# Patient Record
Sex: Female | Born: 1960 | Race: White | Hispanic: No | Smoking: Former smoker
Health system: Southern US, Community
[De-identification: ages and names within clinical notes are randomized; demographics above are authoritative.]

## PROBLEM LIST (undated history)

## (undated) DIAGNOSIS — K219 Gastro-esophageal reflux disease without esophagitis: Secondary | ICD-10-CM

## (undated) DIAGNOSIS — Z720 Tobacco use: Secondary | ICD-10-CM

## (undated) DIAGNOSIS — I509 Heart failure, unspecified: Secondary | ICD-10-CM

## (undated) DIAGNOSIS — R569 Unspecified convulsions: Secondary | ICD-10-CM

## (undated) DIAGNOSIS — F32A Depression, unspecified: Secondary | ICD-10-CM

## (undated) DIAGNOSIS — T4145XA Adverse effect of unspecified anesthetic, initial encounter: Secondary | ICD-10-CM

## (undated) DIAGNOSIS — R112 Nausea with vomiting, unspecified: Secondary | ICD-10-CM

## (undated) DIAGNOSIS — J45909 Unspecified asthma, uncomplicated: Secondary | ICD-10-CM

## (undated) DIAGNOSIS — I499 Cardiac arrhythmia, unspecified: Secondary | ICD-10-CM

## (undated) DIAGNOSIS — F329 Major depressive disorder, single episode, unspecified: Secondary | ICD-10-CM

## (undated) DIAGNOSIS — Z8614 Personal history of Methicillin resistant Staphylococcus aureus infection: Secondary | ICD-10-CM

## (undated) DIAGNOSIS — I10 Essential (primary) hypertension: Secondary | ICD-10-CM

## (undated) DIAGNOSIS — T8859XA Other complications of anesthesia, initial encounter: Secondary | ICD-10-CM

## (undated) DIAGNOSIS — Z780 Asymptomatic menopausal state: Secondary | ICD-10-CM

## (undated) DIAGNOSIS — Z9889 Other specified postprocedural states: Secondary | ICD-10-CM

## (undated) DIAGNOSIS — M797 Fibromyalgia: Secondary | ICD-10-CM

## (undated) DIAGNOSIS — Z716 Tobacco abuse counseling: Secondary | ICD-10-CM

## (undated) DIAGNOSIS — J449 Chronic obstructive pulmonary disease, unspecified: Secondary | ICD-10-CM

## (undated) DIAGNOSIS — I219 Acute myocardial infarction, unspecified: Secondary | ICD-10-CM

## (undated) DIAGNOSIS — I639 Cerebral infarction, unspecified: Secondary | ICD-10-CM

## (undated) DIAGNOSIS — F4310 Post-traumatic stress disorder, unspecified: Secondary | ICD-10-CM

## (undated) DIAGNOSIS — F419 Anxiety disorder, unspecified: Secondary | ICD-10-CM

## (undated) HISTORY — DX: Tobacco use: Z72.0

## (undated) HISTORY — PX: SPINE SURGERY: SHX786

## (undated) HISTORY — DX: Tobacco abuse counseling: Z71.6

## (undated) HISTORY — DX: Essential (primary) hypertension: I10

## (undated) HISTORY — DX: Asymptomatic menopausal state: Z78.0

---
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
## 1996-11-01 HISTORY — PX: LAPAROSCOPIC SUPRACERVICAL HYSTERECTOMY: SUR797

## 2002-02-19 ENCOUNTER — Inpatient Hospital Stay (HOSPITAL_COMMUNITY): Admission: EM | Admit: 2002-02-19 | Discharge: 2002-02-22 | Payer: Self-pay | Admitting: Psychiatry

## 2002-02-23 ENCOUNTER — Other Ambulatory Visit (HOSPITAL_COMMUNITY): Admission: RE | Admit: 2002-02-23 | Discharge: 2002-03-08 | Payer: Self-pay | Admitting: Psychiatry

## 2002-03-20 ENCOUNTER — Encounter: Admission: RE | Admit: 2002-03-20 | Discharge: 2002-03-20 | Payer: Self-pay | Admitting: *Deleted

## 2002-07-30 ENCOUNTER — Inpatient Hospital Stay (HOSPITAL_COMMUNITY): Admission: RE | Admit: 2002-07-30 | Discharge: 2002-07-31 | Payer: Self-pay | Admitting: Neurosurgery

## 2002-07-30 ENCOUNTER — Encounter: Payer: Self-pay | Admitting: Neurosurgery

## 2002-10-12 ENCOUNTER — Ambulatory Visit: Admission: RE | Admit: 2002-10-12 | Discharge: 2002-10-12 | Payer: Self-pay | Admitting: *Deleted

## 2002-12-12 ENCOUNTER — Encounter: Admission: RE | Admit: 2002-12-12 | Discharge: 2002-12-12 | Payer: Self-pay | Admitting: *Deleted

## 2003-03-25 ENCOUNTER — Encounter: Admission: RE | Admit: 2003-03-25 | Discharge: 2003-03-25 | Payer: Self-pay | Admitting: *Deleted

## 2007-11-02 DIAGNOSIS — Z8489 Family history of other specified conditions: Secondary | ICD-10-CM

## 2007-11-02 HISTORY — DX: Family history of other specified conditions: Z84.89

## 2009-05-29 ENCOUNTER — Ambulatory Visit: Payer: Self-pay | Admitting: Otolaryngology

## 2009-06-03 ENCOUNTER — Ambulatory Visit: Payer: Self-pay | Admitting: Otolaryngology

## 2009-06-09 ENCOUNTER — Ambulatory Visit: Payer: Self-pay | Admitting: Otolaryngology

## 2009-06-09 ENCOUNTER — Ambulatory Visit: Payer: Self-pay | Admitting: Cardiology

## 2009-06-11 ENCOUNTER — Ambulatory Visit: Payer: Self-pay | Admitting: Otolaryngology

## 2011-07-08 ENCOUNTER — Other Ambulatory Visit: Payer: Self-pay | Admitting: Internal Medicine

## 2011-07-09 MED ORDER — QUETIAPINE FUMARATE 300 MG PO TABS: 300.0000 mg | ORAL_TABLET | Freq: Every day | ORAL | Status: DC

## 2011-07-09 NOTE — Telephone Encounter (Signed)
I did not recognize patients name either, the pharmacy stated you were the last one to refill medication.

## 2011-07-09 NOTE — Telephone Encounter (Signed)
I do not recognize this patient's name.  Can we confirm that she is my patient?

## 2011-08-06 ENCOUNTER — Other Ambulatory Visit: Payer: Self-pay | Admitting: Internal Medicine

## 2011-08-06 MED ORDER — HYDROCODONE-ACETAMINOPHEN 7.5-750 MG PO TABS: 1.0000 | ORAL_TABLET | Freq: Two times a day (BID) | ORAL | Status: DC | PRN

## 2011-09-02 ENCOUNTER — Other Ambulatory Visit: Payer: Self-pay | Admitting: Internal Medicine

## 2011-09-02 NOTE — Telephone Encounter (Signed)
This patient has not been seen here yet or abstracted, so I cannot  Fill this without having her records,

## 2011-09-03 ENCOUNTER — Other Ambulatory Visit: Payer: Self-pay | Admitting: Internal Medicine

## 2011-09-06 ENCOUNTER — Telehealth: Payer: Self-pay | Admitting: Internal Medicine

## 2011-09-06 NOTE — Telephone Encounter (Signed)
It has not been filled because she has not been seen her at the new office and i do not have her records so I cannot refill it wihtout knowing why she is still needing it.

## 2011-09-06 NOTE — Telephone Encounter (Signed)
Patient needing a refill on her Hydrocodone. Pleasecall patient if there is any problems.

## 2011-09-06 NOTE — Telephone Encounter (Signed)
Patients phone number has been disconnected.

## 2011-09-06 NOTE — Telephone Encounter (Signed)
Yes,m she has not been seen at the nerw office and her records have not been abstracted os I do not know what it is for.

## 2011-09-07 ENCOUNTER — Ambulatory Visit (INDEPENDENT_AMBULATORY_CARE_PROVIDER_SITE_OTHER): Payer: 59 | Admitting: Internal Medicine

## 2011-09-07 ENCOUNTER — Encounter: Payer: Self-pay | Admitting: Internal Medicine

## 2011-09-07 VITALS — BP 142/84 | HR 114 | Temp 98.3°F | Resp 16 | Ht 63.0 in | Wt 139.8 lb

## 2011-09-07 DIAGNOSIS — F4310 Post-traumatic stress disorder, unspecified: Secondary | ICD-10-CM | POA: Insufficient documentation

## 2011-09-07 DIAGNOSIS — Z13220 Encounter for screening for lipoid disorders: Secondary | ICD-10-CM

## 2011-09-07 DIAGNOSIS — Z716 Tobacco abuse counseling: Secondary | ICD-10-CM

## 2011-09-07 DIAGNOSIS — Z1239 Encounter for other screening for malignant neoplasm of breast: Secondary | ICD-10-CM

## 2011-09-07 DIAGNOSIS — Z7189 Other specified counseling: Secondary | ICD-10-CM

## 2011-09-07 DIAGNOSIS — N952 Postmenopausal atrophic vaginitis: Secondary | ICD-10-CM

## 2011-09-07 DIAGNOSIS — R5383 Other fatigue: Secondary | ICD-10-CM

## 2011-09-07 MED ORDER — ESTROGENS, CONJUGATED 0.625 MG/GM VA CREA: 1.0000 g | TOPICAL_CREAM | Freq: Every day | VAGINAL | Status: AC

## 2011-09-07 MED ORDER — HYDROCODONE-ACETAMINOPHEN 7.5-750 MG PO TABS: 1.0000 | ORAL_TABLET | Freq: Two times a day (BID) | ORAL | Status: DC | PRN

## 2011-09-07 MED ORDER — ALPRAZOLAM 0.5 MG PO TABS: 0.5000 mg | ORAL_TABLET | Freq: Every day | ORAL | Status: DC

## 2011-09-07 NOTE — Progress Notes (Signed)
  Subjective:    Patient ID: Jessica Moran, female    DOB: [birth year], 50 y.o.   MRN: [id number]  HPI Ms. Brooke Dare (until recently, Ladona Ridgel, now divorced) is a 50 yo white female with a history of OA of hip since 2006, treated prevsiously with NSAIDs which adveresely affected her blood pressure.Her pain si currently managed with vicodin, but she has run ou and has been using naproxen in the interi.  The pain is now constant, worse in the evening and wakes her up at night,  Right hip, last x ray was normal 2006.  Marland Kitchen Also has low back pain secondary to DJD.  Prior cervical stenosis treated surgically by [facility] Coral about 10 ys ago with resolution of pain and numbness.  Back pain is constant, nonradiating,  24/7.  Her last use of physical therapy to address back pain was 10 yrs ago. Not interested in physical therapy currently because of her work and family schedule. Uses two vicodin daily to control her pain. Past Medical History  Diagnosis Date  . Tobacco abuse counseling       Review of Systems  Constitutional: Negative for fever, chills and unexpected weight change.  HENT: Negative for hearing loss, ear pain, nosebleeds, congestion, sore throat, facial swelling, rhinorrhea, sneezing, mouth sores, trouble swallowing, neck pain, neck stiffness, voice change, postnasal drip, sinus pressure, tinnitus and ear discharge.   Eyes: Negative for pain, discharge, redness and visual disturbance.  Respiratory: Negative for cough, chest tightness, shortness of breath, wheezing and stridor.   Cardiovascular: Negative for chest pain, palpitations and leg swelling.  Genitourinary: Positive for vaginal pain.  Musculoskeletal: Positive for back pain. Negative for myalgias and arthralgias.  Skin: Negative for color change and rash.  Neurological: Negative for dizziness, weakness, light-headedness and headaches.  Hematological: Negative for adenopathy.   BP 142/84  Pulse 114  Temp(Src) 98.3 F (36.8 C) (Oral)  Resp  16  Ht 5\' 3"  (1.6 m)  Wt 139 lb 12 oz (63.39 kg)  BMI 24.76 kg/m2  SpO2 97%     Objective:   Physical Exam  Constitutional: She is oriented to person, place, and time. She appears well-developed and well-nourished.  HENT:  Mouth/Throat: Oropharynx is clear and moist.  Eyes: EOM are normal. Pupils are equal, round, and reactive to light. No scleral icterus.  Neck: Normal range of motion. Neck supple. No JVD present. No thyromegaly present.  Cardiovascular: Normal rate, regular rhythm, normal heart sounds and intact distal pulses.   Pulmonary/Chest: Effort normal and breath sounds normal.  Abdominal: Soft. Bowel sounds are normal. She exhibits no mass. There is no tenderness.  Musculoskeletal: Normal range of motion. She exhibits no edema.  Lymphadenopathy:    She has no cervical adenopathy.  Neurological: She is alert and oriented to person, place, and time.  Skin: Skin is warm and dry.  Psychiatric: She has a normal mood and affect.          Assessment & Plan:  Chronic low back pain :  Secondary to DDD: no signs or worsening stenosis.  Ok to refill vicodin.  Right hip pain:  Given her history of tobacco abuse, need to rule out AVN of hip.  Will order plain films of right hip.  Vaginal atrophy:  Trial of vaginal estrogen. Tobacco abuse:  counselling given.

## 2011-09-07 NOTE — Telephone Encounter (Signed)
Tried calling again. Number is still disconnected.

## 2011-09-08 ENCOUNTER — Encounter: Payer: Self-pay | Admitting: Internal Medicine

## 2011-09-08 DIAGNOSIS — I10 Essential (primary) hypertension: Secondary | ICD-10-CM | POA: Insufficient documentation

## 2011-09-08 DIAGNOSIS — Z720 Tobacco use: Secondary | ICD-10-CM | POA: Insufficient documentation

## 2011-09-08 DIAGNOSIS — Z780 Asymptomatic menopausal state: Secondary | ICD-10-CM | POA: Insufficient documentation

## 2011-09-08 NOTE — Assessment & Plan Note (Signed)
She was counselled briefly on the risks pf continued tobacco abuse and encouraged to quit using nonpharmacologic mehtods

## 2011-09-09 NOTE — Telephone Encounter (Signed)
Tried calling patient on all numbers listed in chart. One of the numbers have been disconnected and the other two are the wrong number. Will wait for patient to call office will ask for updated number.

## 2011-09-10 ENCOUNTER — Telehealth: Payer: Self-pay | Admitting: Internal Medicine

## 2011-09-10 MED ORDER — AZITHROMYCIN 250 MG PO TABS: ORAL_TABLET | ORAL | Status: AC

## 2011-09-10 NOTE — Telephone Encounter (Signed)
Given that Dr. Darrick Huntsman is not here and she was just seen, we can call in a Z-pack. Let's tenatively set up a follow up with Dr. Darrick Huntsman for next week in case symptoms not improving.

## 2011-09-10 NOTE — Telephone Encounter (Signed)
Patient notified. Rx sent to pharmacy. Patient says that she will call back to schedule appt .

## 2011-09-10 NOTE — Telephone Encounter (Signed)
Patient needing a Z-pak for her bronchitis.

## 2011-09-10 NOTE — Telephone Encounter (Signed)
Patient returned call. She says that all week she has felt like she had a cold. Started out with just a dry cough. She says that now she feels that it has all moved into her chest and she is worried that it has turned into bronchitis because she gets this every year. She is asking if she can get a zpak called in since she was just seen by Dr. Darrick Huntsman this week. I advised her that Dr. Darrick Huntsman is not here, but that I would send a note to Dr. Dan Humphreys. Please advise.

## 2011-09-10 NOTE — Telephone Encounter (Signed)
We need more information. Left message asking patient to return my call.

## 2011-11-04 ENCOUNTER — Other Ambulatory Visit: Payer: Self-pay | Admitting: *Deleted

## 2011-11-04 MED ORDER — QUETIAPINE FUMARATE 300 MG PO TABS: 300.0000 mg | ORAL_TABLET | Freq: Every day | ORAL | Status: DC

## 2011-11-04 NOTE — Telephone Encounter (Signed)
Faxed request from [facility], last filled 10/03/11.

## 2011-11-22 ENCOUNTER — Telehealth: Payer: Self-pay | Admitting: Internal Medicine

## 2011-11-22 NOTE — Telephone Encounter (Signed)
Increasing the alprazolam to 1 mg daily prn #30 no refills,  Ok to call in

## 2011-11-22 NOTE — Telephone Encounter (Signed)
Patient having hormonal problems wants to be seen.

## 2011-11-22 NOTE — Telephone Encounter (Signed)
Spoke with patient, she says that this has been going on for a while and that she discussed all of this with you at her last appt, but it hasn't gotten much better, she is still having hot flashed, still having hard time sleeping at night, still feeling tired at night. Patient has an appt scheduled for 12-03-11. Patient is asking if she can increase her xanax until she is seen. If so she will need new script

## 2011-11-23 MED ORDER — ALPRAZOLAM 1 MG PO TABS: 1.0000 mg | ORAL_TABLET | Freq: Every evening | ORAL | Status: AC | PRN

## 2011-11-23 NOTE — Telephone Encounter (Signed)
Left detailed message notifying patient. Rx called to pharmacy.

## 2011-12-03 ENCOUNTER — Encounter: Payer: Self-pay | Admitting: Internal Medicine

## 2011-12-03 ENCOUNTER — Ambulatory Visit (INDEPENDENT_AMBULATORY_CARE_PROVIDER_SITE_OTHER): Payer: 59 | Admitting: Internal Medicine

## 2011-12-03 DIAGNOSIS — Z780 Asymptomatic menopausal state: Secondary | ICD-10-CM

## 2011-12-03 DIAGNOSIS — N644 Mastodynia: Secondary | ICD-10-CM

## 2011-12-03 DIAGNOSIS — Z7189 Other specified counseling: Secondary | ICD-10-CM

## 2011-12-03 DIAGNOSIS — R5383 Other fatigue: Secondary | ICD-10-CM

## 2011-12-03 DIAGNOSIS — Z13220 Encounter for screening for lipoid disorders: Secondary | ICD-10-CM

## 2011-12-03 DIAGNOSIS — N951 Menopausal and female climacteric states: Secondary | ICD-10-CM

## 2011-12-03 DIAGNOSIS — Z716 Tobacco abuse counseling: Secondary | ICD-10-CM

## 2011-12-03 DIAGNOSIS — R5381 Other malaise: Secondary | ICD-10-CM

## 2011-12-03 DIAGNOSIS — F4310 Post-traumatic stress disorder, unspecified: Secondary | ICD-10-CM

## 2011-12-03 MED ORDER — ESCITALOPRAM OXALATE 10 MG PO TABS: 10.0000 mg | ORAL_TABLET | Freq: Every day | ORAL | Status: DC
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
## 2011-12-05 ENCOUNTER — Encounter: Payer: Self-pay | Admitting: Internal Medicine

## 2011-12-05 DIAGNOSIS — N644 Mastodynia: Secondary | ICD-10-CM | POA: Insufficient documentation

## 2011-12-05 NOTE — Assessment & Plan Note (Signed)
Now resolved spontaneously. She did undergo mammogram and ultrasound to rule out perforation of saline implants. I suspect that she was having fluid retention which was causing increased pressure. We discussed the next time this happened to it at a trial of a diuretic to see if it helped. She will need to be seen of course make sure that she's not having mastitis or other signs of cellulitis.

## 2011-12-05 NOTE — Assessment & Plan Note (Signed)
Spent 20 minutes discussing the patient's current tobacco use and its hazards. She will attempt cessation once her anxiety and menopause symptoms are under control. She is not interested in pharmacotherapy at this point.

## 2011-12-05 NOTE — Assessment & Plan Note (Signed)
With hot flashes mood swings and sleep disruption. She is not a candidate for oral or systemic hormone replacement therapy due to her tobacco use. She is using vaginal estrogen. Discussed trial of Lexapro for management of hot flashes. Currently she is using alprazolam twice daily which has is helping but we are both concerned about it becoming habit-forming.
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
## 2011-12-27 ENCOUNTER — Other Ambulatory Visit: Payer: Self-pay | Admitting: Internal Medicine

## 2011-12-27 DIAGNOSIS — F4310 Post-traumatic stress disorder, unspecified: Secondary | ICD-10-CM

## 2011-12-27 MED ORDER — ALPRAZOLAM 0.5 MG PO TABS: 0.5000 mg | ORAL_TABLET | Freq: Every day | ORAL | Status: DC

## 2011-12-29 ENCOUNTER — Other Ambulatory Visit: Payer: Self-pay | Admitting: Internal Medicine

## 2011-12-29 MED ORDER — ALPRAZOLAM 1 MG PO TABS: 1.0000 mg | ORAL_TABLET | Freq: Every evening | ORAL | Status: AC | PRN

## 2011-12-30 ENCOUNTER — Other Ambulatory Visit: Payer: Self-pay | Admitting: Internal Medicine

## 2011-12-31 ENCOUNTER — Other Ambulatory Visit: Payer: Self-pay | Admitting: Internal Medicine

## 2011-12-31 MED ORDER — HYDROCODONE-ACETAMINOPHEN 7.5-750 MG PO TABS: 1.0000 | ORAL_TABLET | Freq: Two times a day (BID) | ORAL | Status: DC | PRN

## 2012-01-06 ENCOUNTER — Ambulatory Visit: Payer: 59 | Admitting: Internal Medicine

## 2012-01-06 DIAGNOSIS — Z0289 Encounter for other administrative examinations: Secondary | ICD-10-CM

## 2012-01-31 ENCOUNTER — Other Ambulatory Visit: Payer: Self-pay | Admitting: Internal Medicine

## 2012-01-31 MED ORDER — HYDROCODONE-ACETAMINOPHEN 7.5-750 MG PO TABS: 1.0000 | ORAL_TABLET | Freq: Two times a day (BID) | ORAL | Status: DC | PRN

## 2012-02-09 ENCOUNTER — Other Ambulatory Visit (INDEPENDENT_AMBULATORY_CARE_PROVIDER_SITE_OTHER): Payer: 59 | Admitting: *Deleted

## 2012-02-09 DIAGNOSIS — Z13220 Encounter for screening for lipoid disorders: Secondary | ICD-10-CM

## 2012-02-09 DIAGNOSIS — R5383 Other fatigue: Secondary | ICD-10-CM

## 2012-02-09 LAB — LIPID PANEL
Cholesterol: 226 mg/dL — ABNORMAL HIGH (ref 0–200)
Total CHOL/HDL Ratio: 2
Triglycerides: 82 mg/dL (ref 0.0–149.0)
VLDL: 16.4 mg/dL (ref 0.0–40.0)

## 2012-02-09 LAB — CBC WITH DIFFERENTIAL/PLATELET
Basophils Relative: 1.2 % (ref 0.0–3.0)
Eosinophils Absolute: 0.2 10*3/uL (ref 0.0–0.7)
Hemoglobin: 13.9 g/dL (ref 12.0–15.0)
MCHC: 33.8 g/dL (ref 30.0–36.0)
MCV: 97.1 fl (ref 78.0–100.0)
Monocytes Absolute: 0.5 10*3/uL (ref 0.1–1.0)
Neutro Abs: 3.1 10*3/uL (ref 1.4–7.7)
RBC: 4.22 Mil/uL (ref 3.87–5.11)

## 2012-02-09 LAB — COMPREHENSIVE METABOLIC PANEL
AST: 24 U/L (ref 0–37)
Alkaline Phosphatase: 84 U/L (ref 39–117)
BUN: 13 mg/dL (ref 6–23)
Creatinine, Ser: 0.6 mg/dL (ref 0.4–1.2)

## 2012-02-10 LAB — LDL CHOLESTEROL, DIRECT: Direct LDL: 110.6 mg/dL

## 2012-02-14 ENCOUNTER — Encounter: Payer: 59 | Admitting: Internal Medicine

## 2012-03-30 ENCOUNTER — Encounter: Payer: 59 | Admitting: Internal Medicine

## 2012-03-30 DIAGNOSIS — Z0289 Encounter for other administrative examinations: Secondary | ICD-10-CM

## 2012-04-17 ENCOUNTER — Other Ambulatory Visit: Payer: Self-pay | Admitting: Internal Medicine

## 2012-04-19 ENCOUNTER — Other Ambulatory Visit: Payer: Self-pay | Admitting: Internal Medicine

## 2012-04-19 MED ORDER — ALPRAZOLAM 0.5 MG PO TABS: 0.5000 mg | ORAL_TABLET | Freq: Every evening | ORAL | Status: DC | PRN

## 2012-04-19 NOTE — Telephone Encounter (Signed)
rx changed to generic tablet,  Not the sublingual

## 2012-05-02 ENCOUNTER — Other Ambulatory Visit: Payer: Self-pay | Admitting: *Deleted

## 2012-05-02 MED ORDER — ALPRAZOLAM 0.5 MG PO TABS: 0.5000 mg | ORAL_TABLET | Freq: Every evening | ORAL | Status: DC | PRN

## 2012-05-09 ENCOUNTER — Other Ambulatory Visit: Payer: Self-pay | Admitting: Internal Medicine

## 2012-05-15 ENCOUNTER — Telehealth: Payer: Self-pay | Admitting: *Deleted

## 2012-05-15 NOTE — Telephone Encounter (Signed)
Received phone call from pharmacy stating that patient is requesting rx for 1 mg of alprazolam. We call in rx for the 0.5 on 05-02-12. Pharmacy states that she is supposed to have rx for both, but the 1 mg is not in her chart. Please advise.

## 2012-05-16 NOTE — Telephone Encounter (Signed)
Yes she was prescribed both per last office visit note.  Ok to refill both . But she no showed last visit so 2 refills only . Must be seen

## 2012-05-17 ENCOUNTER — Other Ambulatory Visit: Payer: Self-pay | Admitting: *Deleted

## 2012-05-17 MED ORDER — ALPRAZOLAM 1 MG PO TABS: 1.0000 mg | ORAL_TABLET | Freq: Every day | ORAL | Status: AC

## 2012-05-17 NOTE — Telephone Encounter (Signed)
Rx phoned in.   

## 2012-05-22 ENCOUNTER — Other Ambulatory Visit: Payer: Self-pay | Admitting: *Deleted

## 2012-05-23 ENCOUNTER — Other Ambulatory Visit: Payer: Self-pay | Admitting: Internal Medicine

## 2012-05-23 MED ORDER — HYDROCODONE-ACETAMINOPHEN 7.5-750 MG PO TABS
1.0000 | ORAL_TABLET | Freq: Two times a day (BID) | ORAL | Status: DC | PRN
Start: 2012-05-22 — End: 2012-07-19

## 2012-05-23 NOTE — Telephone Encounter (Signed)
No more refills without office visit.

## 2012-05-26 ENCOUNTER — Telehealth: Payer: Self-pay | Admitting: Internal Medicine

## 2012-05-26 NOTE — Telephone Encounter (Signed)
Casimiro Needle advised as instructed via telephone, #60 no refills.

## 2012-05-26 NOTE — Telephone Encounter (Signed)
Michael from [facility] called and stated the hydrocodone acetaminophen 7.5-750 mg has been discontinued.  He stated the new strength is 7.5-300 mg.  Is this ok to change?

## 2012-05-26 NOTE — Telephone Encounter (Signed)
Yes, ok to substitute

## 2012-07-17 ENCOUNTER — Other Ambulatory Visit: Payer: Self-pay | Admitting: Internal Medicine

## 2012-07-17 MED ORDER — ALPRAZOLAM 0.5 MG PO TABS: 0.5000 mg | ORAL_TABLET | Freq: Every evening | ORAL | Status: DC | PRN

## 2012-07-19 ENCOUNTER — Encounter: Payer: Self-pay | Admitting: Internal Medicine

## 2012-07-19 ENCOUNTER — Other Ambulatory Visit: Payer: Self-pay | Admitting: Internal Medicine

## 2012-07-19 DIAGNOSIS — M5136 Other intervertebral disc degeneration, lumbar region: Secondary | ICD-10-CM | POA: Insufficient documentation

## 2012-07-19 DIAGNOSIS — M169 Osteoarthritis of hip, unspecified: Secondary | ICD-10-CM | POA: Insufficient documentation

## 2012-07-19 MED ORDER — HYDROCODONE-ACETAMINOPHEN 7.5-750 MG PO TABS
1.0000 | ORAL_TABLET | Freq: Two times a day (BID) | ORAL | Status: DC | PRN
Start: 2012-07-19 — End: 2012-08-12

## 2012-08-10 ENCOUNTER — Other Ambulatory Visit: Payer: Self-pay | Admitting: Internal Medicine

## 2012-08-10 ENCOUNTER — Other Ambulatory Visit: Payer: Self-pay

## 2012-08-11 ENCOUNTER — Other Ambulatory Visit: Payer: Self-pay

## 2012-08-12 ENCOUNTER — Other Ambulatory Visit: Payer: Self-pay | Admitting: Internal Medicine

## 2012-08-13 NOTE — Telephone Encounter (Signed)
Alprazolam and vicodin refills authorized for one month only .  Needs office visit.

## 2012-08-14 ENCOUNTER — Other Ambulatory Visit: Payer: Self-pay

## 2012-08-14 NOTE — Telephone Encounter (Signed)
Rx xanax 1 mg #30 2 R and Hydrocodone Acetaminophen #60 0 R called in to pharmacy.

## 2012-08-15 ENCOUNTER — Other Ambulatory Visit: Payer: Self-pay | Admitting: Internal Medicine

## 2012-08-16 ENCOUNTER — Other Ambulatory Visit: Payer: Self-pay

## 2012-08-16 NOTE — Telephone Encounter (Addendum)
Rx hydrocodone-Acetamephin called in to [facility] [place]. Patient advised no further refills until she has an OV.

## 2012-08-16 NOTE — Telephone Encounter (Signed)
Believe this was handled yesterday. I authorized a 30 day supply and no more refills until she is seen

## 2012-09-07 ENCOUNTER — Other Ambulatory Visit: Payer: Self-pay

## 2012-09-07 NOTE — Telephone Encounter (Signed)
[facility] from [facility]  for Hydrocodone Acetaminophen 7.5-750 mg. Ok to refill? 

## 2012-09-08 ENCOUNTER — Other Ambulatory Visit: Payer: Self-pay

## 2012-09-08 MED ORDER — HYDROCODONE-ACETAMINOPHEN 7.5-750 MG PO TABS: 1.0000 | ORAL_TABLET | Freq: Two times a day (BID) | ORAL | Status: DC

## 2012-09-08 NOTE — Telephone Encounter (Signed)
[facility]ed to [facility] [phone]

## 2012-09-08 NOTE — Telephone Encounter (Signed)
Ok to refill,  Authorized in epic 

## 2012-09-08 NOTE — Telephone Encounter (Signed)
[facility] faxed to [facility] [phone]

## 2012-09-08 NOTE — Telephone Encounter (Signed)
Patient called requesting a refill on her hydrocodone Acetaminophen due to her insurance running out on tomorrow. Ok to refill.

## 2012-09-08 NOTE — Telephone Encounter (Signed)
30 days supply authorozed. Must be seen priro to additional refills

## 2012-09-11 ENCOUNTER — Other Ambulatory Visit: Payer: Self-pay | Admitting: Internal Medicine

## 2012-09-11 NOTE — Telephone Encounter (Signed)
[facility] [address] [facility] request  For vicidon.  Pt will lose her insurance end of week  And would like to get this refilled before end of week.  Please advise pt

## 2012-09-15 ENCOUNTER — Other Ambulatory Visit: Payer: Self-pay

## 2012-09-15 MED ORDER — HYDROCODONE-ACETAMINOPHEN 7.5-750 MG PO TABS: 1.0000 | ORAL_TABLET | Freq: Two times a day (BID) | ORAL | Status: DC

## 2012-09-15 NOTE — Telephone Encounter (Signed)
Rx Hydrocodone-Acetaminophen # 60 0 R called to [facility]

## 2012-09-15 NOTE — Telephone Encounter (Signed)
This was authorized on 11/8 see med refill history in chart

## 2012-10-04 ENCOUNTER — Other Ambulatory Visit: Payer: Self-pay | Admitting: Internal Medicine

## 2012-10-10 ENCOUNTER — Other Ambulatory Visit: Payer: Self-pay

## 2012-10-10 DIAGNOSIS — F4310 Post-traumatic stress disorder, unspecified: Secondary | ICD-10-CM

## 2012-10-10 MED ORDER — ALPRAZOLAM 1 MG PO TABS: 1.0000 mg | ORAL_TABLET | Freq: Every evening | ORAL | Status: DC | PRN

## 2012-10-10 NOTE — Addendum Note (Signed)
Addended by: Sherlene Shams on: 10/10/2012 05:18 PM   Modules accepted: Orders

## 2012-10-10 NOTE — Telephone Encounter (Signed)
Refill request for Xanax 0.5 mg. Ok to refill?

## 2012-10-10 NOTE — Telephone Encounter (Signed)
Alprazolam refills denied  She no showed her last appt and was given courtesy refills with the expectation that she would keep her appts. I do not want her to go through withdrawal so I will refill one prescription for 30 days only.  Absolutely no more refills after that unless she is seen

## 2012-10-11 ENCOUNTER — Other Ambulatory Visit: Payer: Self-pay

## 2012-10-11 NOTE — Telephone Encounter (Signed)
Xanax 1 mg [facility]ed [facility]

## 2012-10-12 ENCOUNTER — Other Ambulatory Visit: Payer: Self-pay

## 2012-11-02 ENCOUNTER — Other Ambulatory Visit: Payer: Self-pay | Admitting: Internal Medicine

## 2012-11-03 ENCOUNTER — Other Ambulatory Visit: Payer: Self-pay | Admitting: Internal Medicine

## 2012-11-03 DIAGNOSIS — F4310 Post-traumatic stress disorder, unspecified: Secondary | ICD-10-CM

## 2012-11-03 NOTE — Telephone Encounter (Signed)
Refill denied .  Has not kept appts has not been since in 11 months

## 2012-11-03 NOTE — Telephone Encounter (Signed)
Alprazolam 0.5 mg tablet  Take 1 tablet at bedtime as needed

## 2012-11-03 NOTE — Telephone Encounter (Signed)
Forwarding to Dr.Tullo 

## 2012-11-06 LAB — COMPREHENSIVE METABOLIC PANEL
Albumin: 3.8 g/dL (ref 3.4–5.0)
Alkaline Phosphatase: 126 U/L (ref 50–136)
BUN: 6 mg/dL — ABNORMAL LOW (ref 7–18)
Calcium, Total: 8.5 mg/dL (ref 8.5–10.1)
Creatinine: 0.79 mg/dL (ref 0.60–1.30)
Glucose: 96 mg/dL (ref 65–99)
Potassium: 3.9 mmol/L (ref 3.5–5.1)
SGOT(AST): 36 U/L (ref 15–37)
SGPT (ALT): 37 U/L (ref 12–78)

## 2012-11-06 LAB — URINALYSIS, COMPLETE
Glucose,UR: NEGATIVE mg/dL (ref 0–75)
Hyaline Cast: 8
Nitrite: NEGATIVE
Ph: 5 (ref 4.5–8.0)
Protein: 30
RBC,UR: 1 /HPF (ref 0–5)
Squamous Epithelial: 14

## 2012-11-06 LAB — CBC
HCT: 42.5 % (ref 35.0–47.0)
MCHC: 33 g/dL (ref 32.0–36.0)
MCV: 95 fL (ref 80–100)
RBC: 4.5 10*6/uL (ref 3.80–5.20)
RDW: 12.6 % (ref 11.5–14.5)
WBC: 4.1 10*3/uL (ref 3.6–11.0)

## 2012-11-06 MED ORDER — ALPRAZOLAM 1 MG PO TABS: 1.0000 mg | ORAL_TABLET | Freq: Every evening | ORAL | Status: DC | PRN

## 2012-11-06 NOTE — Telephone Encounter (Signed)
Per Pt circumstances Dr. Darrick Huntsman advised this medication refill for 30 days.

## 2012-11-06 NOTE — Telephone Encounter (Signed)
Refill denied.,  She no showed her last app 6 months ago and has not seen seen since February 2013.  Same goes for narcotics.  No refills until seen,

## 2012-11-07 ENCOUNTER — Observation Stay: Payer: Self-pay | Admitting: Internal Medicine

## 2012-11-07 LAB — TSH: Thyroid Stimulating Horm: 1.83 u[IU]/mL

## 2012-11-07 LAB — MAGNESIUM: Magnesium: 2.2 mg/dL

## 2012-11-08 LAB — DRUG SCREEN, URINE
Amphetamines, Ur Screen: NEGATIVE (ref ?–1000)
Barbiturates, Ur Screen: NEGATIVE (ref ?–200)
Benzodiazepine, Ur Scrn: NEGATIVE (ref ?–200)
Cocaine Metabolite,Ur ~~LOC~~: NEGATIVE (ref ?–300)
MDMA (Ecstasy)Ur Screen: NEGATIVE (ref ?–500)
Methadone, Ur Screen: NEGATIVE (ref ?–300)
Opiate, Ur Screen: NEGATIVE (ref ?–300)
Phencyclidine (PCP) Ur S: NEGATIVE (ref ?–25)
Tricyclic, Ur Screen: NEGATIVE (ref ?–1000)

## 2012-11-08 LAB — WBCS, STOOL

## 2012-11-08 LAB — CLOSTRIDIUM DIFFICILE BY PCR

## 2012-11-09 ENCOUNTER — Telehealth: Payer: Self-pay | Admitting: *Deleted

## 2012-11-09 ENCOUNTER — Ambulatory Visit (INDEPENDENT_AMBULATORY_CARE_PROVIDER_SITE_OTHER): Payer: 59 | Admitting: Internal Medicine

## 2012-11-09 ENCOUNTER — Encounter: Payer: Self-pay | Admitting: Internal Medicine

## 2012-11-09 VITALS — BP 134/82 | HR 104 | Temp 98.2°F | Resp 17 | Wt 160.0 lb

## 2012-11-09 DIAGNOSIS — M1610 Unilateral primary osteoarthritis, unspecified hip: Secondary | ICD-10-CM

## 2012-11-09 DIAGNOSIS — I10 Essential (primary) hypertension: Secondary | ICD-10-CM

## 2012-11-09 DIAGNOSIS — G40909 Epilepsy, unspecified, not intractable, without status epilepticus: Secondary | ICD-10-CM | POA: Insufficient documentation

## 2012-11-09 DIAGNOSIS — F4310 Post-traumatic stress disorder, unspecified: Secondary | ICD-10-CM

## 2012-11-09 DIAGNOSIS — I6350 Cerebral infarction due to unspecified occlusion or stenosis of unspecified cerebral artery: Secondary | ICD-10-CM

## 2012-11-09 DIAGNOSIS — E785 Hyperlipidemia, unspecified: Secondary | ICD-10-CM

## 2012-11-09 DIAGNOSIS — I6381 Other cerebral infarction due to occlusion or stenosis of small artery: Secondary | ICD-10-CM

## 2012-11-09 DIAGNOSIS — M169 Osteoarthritis of hip, unspecified: Secondary | ICD-10-CM

## 2012-11-09 MED ORDER — ESCITALOPRAM OXALATE 10 MG PO TABS: 10.0000 mg | ORAL_TABLET | Freq: Every day | ORAL | Status: DC

## 2012-11-09 MED ORDER — METOPROLOL SUCCINATE ER 25 MG PO TB24: 25.0000 mg | ORAL_TABLET | Freq: Every day | ORAL | Status: DC

## 2012-11-09 MED ORDER — ALPRAZOLAM 1 MG PO TABS: ORAL_TABLET | ORAL | Status: DC

## 2012-11-09 MED ORDER — HYDROCODONE-ACETAMINOPHEN 7.5-300 MG PO TABS: 1.0000 | ORAL_TABLET | Freq: Two times a day (BID) | ORAL | Status: DC | PRN

## 2012-11-09 NOTE — Patient Instructions (Signed)
Please resume your lexapro at 1.2 tablet daily for the first week,  Then on etablet daily  You should not drive a car until Dr. Sherryll Burger authorizes it  If you have any trouble getting the lamictal from your pharmacy call for [place] to send an rx to another pharmacy   You will see Dr Sherryll Burger on Jan 20th  1:15 pm at the [facility] in the medical arts building    Please return in a month
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
## 2012-11-09 NOTE — Telephone Encounter (Signed)
Patient called stating that she forgot to mention to Dr. Darrick Huntsman that when she was d/c from the hospital the doctor mentioned that she needed to be on cholesterol medication.  She would like [facility].

## 2012-11-09 NOTE — Assessment & Plan Note (Addendum)
I have spoken with Dr. Sherryll Burger by phone today. I informed him that her serum May have been due to alprazolam withdrawal since she was taking it twice daily (1.5 mg total daily dose ) and patient states that her last dose was 24 hours prior to seizure.  Dr. Sherryll Burger does not feel that her witnessed seizure based on the description would be congruence with benzo withdrawal but agrees that she should continue her alprazolam as previously taking. Patient will follow up with Dr. Sherryll Burger on January 20. She will start her Lamictal immediately. She cannot drive for 6 months. 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 I suggested that she make a arrangements to have him drive her to her home visits so she can continue to work

## 2012-11-10 LAB — STOOL CULTURE

## 2012-11-10 MED ORDER — PRAVASTATIN SODIUM 20 MG PO TABS: 20.0000 mg | ORAL_TABLET | Freq: Every day | ORAL | Status: DC

## 2012-11-10 NOTE — Telephone Encounter (Signed)
rx for pravastatin sent. She needs to have repeat fasting lipids and CEMT in 6 weeks

## 2012-11-10 NOTE — Telephone Encounter (Signed)
Pt.notified

## 2012-11-11 ENCOUNTER — Encounter: Payer: Self-pay | Admitting: Internal Medicine

## 2012-11-11 DIAGNOSIS — G40909 Epilepsy, unspecified, not intractable, without status epilepticus: Secondary | ICD-10-CM | POA: Insufficient documentation

## 2012-11-11 DIAGNOSIS — I6381 Other cerebral infarction due to occlusion or stenosis of small artery: Secondary | ICD-10-CM | POA: Insufficient documentation

## 2012-11-11 DIAGNOSIS — I10 Essential (primary) hypertension: Secondary | ICD-10-CM | POA: Insufficient documentation
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
## 2012-11-11 NOTE — Assessment & Plan Note (Signed)
Controlled with metoprolol. Patient stopped her medication for unclear reasons several months ago. She was lost to followup as well. She has resumed her Toprol. No changes today

## 2012-11-11 NOTE — Assessment & Plan Note (Addendum)
The MRI with and without contrast done during recent admission for clonic seizure noted a right basal ganglia infarct which was acute. She has no signs of paresthesias or muscle weakness. Her blood pressure is well controlled currently and we'll start pravastatin for lipid management. She will return in 6 weeks for fasting lipids and cmet

## 2012-11-13 ENCOUNTER — Other Ambulatory Visit: Payer: Self-pay | Admitting: Internal Medicine

## 2012-11-13 NOTE — Telephone Encounter (Signed)
Med filled on 11/09/12.

## 2012-11-14 ENCOUNTER — Telehealth: Payer: Self-pay | Admitting: Internal Medicine

## 2012-11-14 ENCOUNTER — Encounter: Payer: Self-pay | Admitting: General Practice

## 2012-11-14 NOTE — Telephone Encounter (Signed)
I can write this note. How long should she be out of work?

## 2012-11-14 NOTE — Telephone Encounter (Signed)
Letter written

## 2012-11-14 NOTE — Telephone Encounter (Signed)
Patient needing a note for work stating she can't drive. Because of her seizures.

## 2012-11-14 NOTE — Telephone Encounter (Signed)
6 months

## 2012-11-21 ENCOUNTER — Telehealth: Payer: Self-pay | Admitting: Internal Medicine

## 2012-11-21 NOTE — Telephone Encounter (Signed)
Placed on providers desk

## 2012-11-21 NOTE — Telephone Encounter (Signed)
Pt dropped off FMLA forms to be filled out. I put them up front in Dr. Melina Schools inbox.

## 2012-11-24 DIAGNOSIS — Z0279 Encounter for issue of other medical certificate: Secondary | ICD-10-CM

## 2012-12-13 ENCOUNTER — Ambulatory Visit: Payer: 59 | Admitting: Internal Medicine

## 2012-12-27 ENCOUNTER — Telehealth: Payer: Self-pay | Admitting: Internal Medicine

## 2012-12-27 MED ORDER — ESCITALOPRAM OXALATE 20 MG PO TABS: 20.0000 mg | ORAL_TABLET | Freq: Every day | ORAL | Status: DC

## 2012-12-27 NOTE — Telephone Encounter (Signed)
Pt asking for Rx for Lexapro.

## 2012-12-27 NOTE — Telephone Encounter (Signed)
Last office visit 11/09/12. [place]. Patient states that she is not doing good on the 10 mg and wants to know if you will increase the Lexapro to 20 Mg? Please advise

## 2012-12-27 NOTE — Telephone Encounter (Signed)
Yes, [facility],  Is she seeing a Haematologist or psychiatrist?

## 2012-12-27 NOTE — Telephone Encounter (Signed)
Pt left vm stating she needs to speak with someone about her lexapro.

## 2012-12-28 NOTE — Telephone Encounter (Signed)
Dr. Darrick Huntsman,  Patient stated that this has been an ongoing problem for her since she was [age] and she has been handling okay. She also expressed that she was fine without counseling and that her life circumstances triggered her mental state.

## 2013-01-04 ENCOUNTER — Telehealth: Payer: Self-pay | Admitting: General Practice

## 2013-01-04 MED ORDER — QUETIAPINE FUMARATE 300 MG PO TABS: 300.0000 mg | ORAL_TABLET | Freq: Every day | ORAL | Status: DC

## 2013-01-04 NOTE — Telephone Encounter (Signed)
Seroquil increased to 150mg  and 300 in the evening, after the hospital visit. Dr. Clelia Croft refilled for one month. He states that Dr. Darrick Huntsman should fill this. Pt would like this to be filled for just the 300mg  in the evening. Can we fill this?

## 2013-01-04 NOTE — Telephone Encounter (Signed)
Ok to refill,  Refill sent  

## 2013-02-28 ENCOUNTER — Telehealth: Payer: Self-pay | Admitting: *Deleted

## 2013-02-28 MED ORDER — HYDROCODONE-ACETAMINOPHEN 7.5-325 MG PO TABS: 1.0000 | ORAL_TABLET | Freq: Two times a day (BID) | ORAL | Status: DC | PRN

## 2013-02-28 NOTE — Telephone Encounter (Signed)
Pt called to report that she has recently moved to [place], [place] & has been working on transferring meds to a Social worker. She states that her Vicodin (last filled on 3/25) has been the only issue. IT was written for 7.5/300 & [facility] only carries the 7.5/325mg . Pt is requesting a change in dose so she can have it filled, & also would like to know if you are familiar with anyone in [place], [place] that she can go see for PCP care. Please advise

## 2013-02-28 NOTE — Telephone Encounter (Signed)
rx changed and printed.  I do not know any MDs in [place]./

## 2013-03-01 NOTE — Telephone Encounter (Signed)
Script called patient notified.

## 2013-03-05 ENCOUNTER — Other Ambulatory Visit: Payer: Self-pay | Admitting: Internal Medicine

## 2013-03-05 NOTE — Telephone Encounter (Signed)
Alprazolam Ok to refill,  printed rx

## 2013-03-05 NOTE — Telephone Encounter (Signed)
Last visit 11/09/12.

## 2013-03-07 ENCOUNTER — Telehealth: Payer: Self-pay | Admitting: Internal Medicine

## 2013-03-07 NOTE — Telephone Encounter (Signed)
Left message on voicemail for patient to return call. 

## 2013-03-07 NOTE — Telephone Encounter (Signed)
Patient left voice mail asking you to return her call. She didn't say what it was reference to she just said she needed to discuss something with you.

## 2013-03-08 NOTE — Telephone Encounter (Signed)
Left message for patient to call office.  

## 2013-03-12 NOTE — Telephone Encounter (Signed)
Made several attempts to call patient . No answer left message.

## 2013-03-13 NOTE — Telephone Encounter (Signed)
Left message for patient to return call to office. 

## 2013-03-15 NOTE — Telephone Encounter (Signed)
tried to return patient call received recording patient not available no voicemail.

## 2013-03-16 NOTE — Telephone Encounter (Signed)
Left message for patient to return call.

## 2013-04-05 ENCOUNTER — Other Ambulatory Visit: Payer: Self-pay | Admitting: Internal Medicine

## 2013-04-05 NOTE — Telephone Encounter (Signed)
Refill? Last visit was followup 11/2012

## 2013-04-05 NOTE — Telephone Encounter (Signed)
Ok to refill,  printed rx  

## 2013-04-09 NOTE — Telephone Encounter (Signed)
Rx was faxed to pharmacy 04/06/13 per Henrene Pastor, LPN.

## 2013-04-18 ENCOUNTER — Telehealth: Payer: Self-pay | Admitting: *Deleted

## 2013-04-18 MED ORDER — ALPRAZOLAM 1 MG PO TABS: ORAL_TABLET | ORAL | Status: DC

## 2013-04-18 MED ORDER — QUETIAPINE FUMARATE 300 MG PO TABS: ORAL_TABLET | ORAL | Status: DC

## 2013-04-18 NOTE — Telephone Encounter (Signed)
Yes, ok to fill both.  rx printed

## 2013-04-18 NOTE — Telephone Encounter (Signed)
Patient has moved to [facility] and is trying to get insurance and a physician she would like to know if you will refill her Alprazolam and her Quetiapine until she can get a physician there just for thirty days please advise.

## 2013-04-18 NOTE — Telephone Encounter (Signed)
Scripts faxed patient notified. 

## 2013-06-03 ENCOUNTER — Other Ambulatory Visit: Payer: Self-pay | Admitting: Internal Medicine

## 2013-06-04 NOTE — Telephone Encounter (Signed)
Okay to refill? 

## 2013-08-14 NOTE — Progress Notes (Signed)
This encounter was created in error - please disregard.

## 2013-09-12 ENCOUNTER — Telehealth: Payer: Self-pay | Admitting: Internal Medicine

## 2013-09-12 NOTE — Telephone Encounter (Signed)
Tried to return patient call no answer, left message.

## 2013-09-12 NOTE — Telephone Encounter (Signed)
Pt left vm.  Has a few questions.  No further detail given.

## 2013-11-01 DIAGNOSIS — I219 Acute myocardial infarction, unspecified: Secondary | ICD-10-CM

## 2013-11-01 HISTORY — DX: Acute myocardial infarction, unspecified: I21.9

## 2015-02-21 NOTE — Discharge Summary (Signed)
PATIENT NAME:  Jessica Moran, Jessica Moran MR#:  [id number] DATE OF BIRTH:  01-Dec-1960  DATE OF ADMISSION:  11/07/2012 DATE OF DISCHARGE:  11/08/2012  PRIMARY CARE PHYSICIAN: Dr. Duncan Dull   CONSULTANTS: Dr. Sherryll Burger of neurology.   DISCHARGE DIAGNOSES: 1. Seizure. 2. History of cerebrovascular accident.  3. Hypertension.  4. Tobacco abuse. 5. Gastroenteritis.   IMAGING STUDIES:  Include an MRI of the brain showed focal punctate areas of lacunar infarct in the basal ganglia bilaterally, chronic.  CT scan of the head showed no acute abnormalities.   ADMITTING HISTORY AND PHYSICAL: Please see detailed H and P dictated on 11/07/2012. In brief, 54 year old Caucasian female patient with hypertension, not on any medications, presented to the Emergency Room complaining of some nausea, vomiting which have resolved, diarrhea resolved, but she had 2 episodes of seizure; one outside the hospital and one in the ER. The patient was admitted to the hospitalist service for further work up and treatment.   HOSPITAL COURSE: Seizure: The patient was seen by Dr. Sherryll Burger of neurology who suggested lamotrigine. The patient did receive Keppra initially, which was stopped.  No further seizures in the hospital. MRI showed some old strokes for which she was started on aspirin, blood pressure medication of metoprolol and statin. I have also counseled the patient for greater than 3 minutes to quit smoking, its effects on stroke and other complications. The patient has verbalized understanding.   On the day of discharge, the patient's temperature is 98.4, blood pressure 132/82, and is being discharged home in fair condition.   The patient did have 3 episodes of diarrhea for which C. diff was checked, which was negative. Stool WBC was negative, was started on Imodium and is being discharged home to follow up with her primary care physician.   DISCHARGE MEDICATIONS: 1. Aspirin 81 mg oral once a day.  2. Zocor 40 mg oral once a day.   3. Metoprolol 25 mg oral once a day.  4. Vicodin 5/500 every 4 hours as needed.  5. Seroquel 50 mg oral 2 times a day, 300 mg at bedtime.  6. Loperamide 2 mg oral 4 times a day as needed for diarrhea.  7. Lamictal 25 mg oral once a day at bedtime for 6 days, then twice a day for 1 week, followed by 50 mg twice a day.   DISCHARGE INSTRUCTIONS: No driving for 6 months. The patient has been counseled to quit smoking. Follow up with Dr. Sherryll Burger of neurology in 1 to 2 weeks. Low sodium diet. Activity as tolerated.   TIME SPENT TODAY ON THIS DISCHARGE: 35 minutes.   ____________________________ Molinda Bailiff Marvelene Stoneberg, MD srs:cb D: 11/08/2012 15:22:00 ET T: 11/08/2012 18:08:07 ET JOB#: [id number]  cc: Duncan Dull, MD Hemang K. Sherryll Burger, MD Bianca Raneri R. Laird Runnion, MD, <Dictator> Orie Fisherman MD ELECTRONICALLY SIGNED 11/09/2012 15:26

## 2015-02-21 NOTE — Consult Note (Signed)
PATIENT NAME:  Jessica Moran, Jessica Moran MR#:  [id number] DATE OF BIRTH:  [birth year]  DATE OF CONSULTATION:  11/07/2012  REFERRING PHYSICIAN:  Carney Corners. Rudene Re, MD CONSULTING PHYSICIAN:  Teyton Pattillo K. Sherryll Burger, MD PRIMARY CARE PHYSICIAN: Duncan Dull, MD   REASON FOR CONSULTATION: Seizure.   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   On 11/06/2012, she had multiple vomiting episodes and did not sleep well that night.   On January 7 morning around 7:30 a.m., husband noticed on the couch that she was having generalized shaking, foaming out of the mouth and some localization event lasting for a couple of minutes, which included tongue biting. He tried to prop her mouth out.   Afterwards, she was disoriented for around 30 minutes or so.   The patient was brought to the ER via EMS. The patient had another seizure around 1:00 p.m. in front of ER physician, when husband noticed that her eyes and head went to the right side. She became unresponsive and was staring with her eyes wide open and then started having generalized shaking and jerking.   She required a couple of doses of Ativan.   They did not notice any focal weakness or numbness, but I noticed right facial weakness.   The patient has a history of alcoholism and has been through the detox program in the past. Lately drinks a couple of glasses of wine, but not alcoholic at present per family.   The patient also has history of bipolar disorder and has been on Seroquel. Has been seen by other behavioral providers in the past. She takes Xanax, but has not been taking it for the last couple of weeks per patient.   The patient does not have a significant electrolyte imbalance on her labs.   PAST MEDICAL HISTORY: Significant for hypertension and bipolar disease.   PAST SURGICAL HISTORY: Significant for hysterectomy and cervical spine surgery.   FAMILY  HISTORY: Significant in that her brother had a history of epilepsy, but he has been seizure-free.   SOCIAL HISTORY: Significant that she is a chronic smoker. She smokes 3/4 of a pack since the age of 71. She drinks wine. Her last drink was yesterday. She is married. She is living with her husband. She works as a Patent examiner.   REVIEW OF SYSTEMS: Difficult to obtain due to her mental status, but she has generalized body pain. She has difficulty talking. She has decreased level of alertness. She was having pain in her tongue.   PHYSICAL EXAMINATION:  VITAL SIGNS: Temperature 98.1, pulse 92, respiratory rate 18, blood pressure 124/76, pulse oximetry 94% on room air.  GENERAL: She is a middle-aged Caucasian female lying in bed, in mild distress due to pain in her mouth. The patient was easily arousable. She has some blood marks on her lips and she had a hard time talking.  LUNGS: Clear to auscultation.  HEART: S1, S2 heart sounds. Carotid exam did not reveal any bruit.  MENTAL STATUS EXAMINATION: She was drowsy but very easily arousable. She was oriented to time, place and person. She followed 2-step inverted commands. She has some dysphonia due to her tongue swelling. The patient does not have neglect.  NEUROLOGIC: Her pupils were equal, round and reactive. Extraocular movements were intact. Her face was asymmetric. She has right lower facial weakness. Her hearing was intact. Her visual fields seem to be fine. Her tongue was  midline. On her motor exam, there was normal tone and strength of 5 out of 5 in all extremities. Her sensations were intact to light touch. Her deep tendon reflexes were symmetric. I did not check her gait.   RADIOLOGICAL DATA: On her MRI of the brain, she does have an area of T1 hypointensity, FLAIR hyperintensity into her right posterior cortex region that is close to her anterior limb of internal capsule. Seems to be a lacunar stroke but old.   ASSESSMENT AND PLAN:  1.   New onset seizure: Two seizures seem to be unprovoked.   The patient does have a history of alcoholism, but lately she has not been drinking a lot and family does not feel like this is alcohol withdrawal seizure.   As she has some focality to her seizure, I do not think this was alcohol withdrawal seizure, as they are usually generalized seizures.   The patient does take benzodiazepine medication, which abrupt withdrawal can lead to seizure, but she takes low dose of Xanax and she has not taken it in a couple of weeks, so that is unlikely the cause of her seizure.   The patient does have significant nausea, vomiting and diarrhea, but she does not have electrolyte imbalance on her blood work, so I have low suspicion that that is the only cause of her seizure.   Based on her semiology with right head and eye deviation with right facial weakness as a residual postictal Todd's phenomenon, I feel like she has complex partial seizure.   I agree with obtaining EEG, which I believe has been done.   I will review the EEG data.   The better antiepileptic medication for her to be started on might be lamotrigine or levetiracetam.   Levetiracetam is known to cause irritability and she has a known bipolar disorder.   We will start her on a low dose of 25 mg at nighttime for 1 week and then 25 mg twice a day and then 50 mg twice a day.   Family should be aware of rash as one of the side effects of lamotrigine. If that happens, they need to stop the lamotrigine.   I talked to the patient and family about etiology, pathogenesis and progression of seizures. I also talked to them about seizure precautions such as no driving for 6 months. The patient should take calcium and vitamin D and folic acid supplementation.   The patient should not engage in any high risk behavior that can put her in danger if she has a seizure.   2.  Stroke: Seems to be an incidental finding on the MRI of the brain. She has a  lacunar infarct but she does have multiple vascular risk factors such as hypertension, long-standing smoking, stress, et Karie Soda. The patient should take an aspirin and a statin. The patient should have good control of her primary risk factors in the long run, that I briefly talked to them.   I will see her in followup as well as outpatient in 1 to 2 weeks. Feel free to contact me with any further questions. I talked to referring physician on the phone as well regarding this patient.   ____________________________ Loras Grieshop K. Sherryll Burger, MD hks:jm D: 11/07/2012 18:27:16 ET T: 11/07/2012 19:48:40 ET JOB#: [id number]  cc: Asheton Scheffler K. Sherryll Burger, MD, <Dictator> Durene Cal [facility] MD ELECTRONICALLY SIGNED 11/10/2012 7:02

## 2015-02-21 NOTE — Consult Note (Signed)
Brief Consult Note: Diagnosis: 2 seizures with residul right face weakness.   Patient was seen by consultant.   Consult note dictated.   Comments: 1) Seizure - 2, seems unprovoked (except poor sleep last nigth and significant vomitting but no significant electrolyte imbalance). (H/o alcoholism, current limited use of alcohol, takes xanax but hasn't taken it for couple of wks) mri no mass or cortical malformation, no MTS EEG results pending lamotrigine might be better Anti Epileptic Drug for her due to h/o bipolar disease. 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  will follow.  Electronic Signatures: Jolene Provost (MD)  (Signed 07-Jan-14 18:15)  Authored: Brief Consult Note   Last Updated: 07-Jan-14 18:15 by Jolene Provost (MD)

## 2015-02-21 NOTE — H&P (Signed)
PATIENT NAME:  Jessica Moran, Jessica Moran MR#:  [id number] DATE OF BIRTH:  Nov 27, 1960  DATE OF ADMISSION:  11/07/2012  PRIMARY CARE PHYSICIAN:  Duncan Dull, MD.  REFERRING PHYSICIAN:  Bayard Males, M.D.   CHIEF COMPLAINT: Seizure activity.   HISTORY OF PRESENT ILLNESS:  The patient is a 54 year old Caucasian female with history of hypertension. She was in her usual state of health until about 24 hours ago when she had symptoms of nausea and 5 episodes of vomiting associated with diarrhea. Her symptoms finally started to ease off by noontime. However, last evening, the patient had witnessed clonic seizure activity while she was at home. Her husband witnessed that and she was brought to the hospital for evaluation. While she was here at the Emergency Department she had another episode of seizure. The patient now is in the process to be admitted for further evaluation and treatment. The patient reports no prior history of seizure activity. She has no headache, but she bit her tongue. Denies any fever, no chills. She was admitted for observation.   REVIEW OF SYSTEMS:  CONSTITUTIONAL: Denies having any fever. No chills. No fatigue.  EYES: No blurring of vision. No double vision.  ENT: No hearing impairment. No sore throat. No dysphagia.  CARDIOVASCULAR: No chest pain. No shortness of breath. No edema. No syncope other than her seizure.  RESPIRATORY: No cough. No sputum production. No chest pain. No shortness of breath.  GASTROINTESTINAL: Earlier she had nausea, vomiting and diarrhea, but this had subsided and now she is feeling better. No abdominal pain.  GENITOURINARY: No dysuria. No frequency of urination.  MUSCULOSKELETAL: No joint pain or swelling other than her chronic back pain. No muscular pain or swelling.  INTEGUMENTARY: No skin rash. No ulcers.  NEUROLOGY: No focal weakness but she had grand mal seizure activity as above. No headache. No ataxia. No head trauma.  PSYCHIATRY: No anxiety or  depression.   ENDOCRINE: No polyuria or polydipsia. No heat or cold intolerance.  HEMATOLOGY: No easy bruisability. No lymph node enlargement.   PAST MEDICAL HISTORY: Systemic hypertension and past history of depression.   PAST SURGICAL HISTORY:  Hysterectomy and cervical effusion.   FAMILY HISTORY: She reports her brother had epilepsy  but for the last several years he was free of any seizure. She has no information about her parents as she is adopted.   SOCIAL HABITS:  Chronic smoker, 3/4 of a pack a day since the age of 74. She drinks wine, about 2 glasses at night, but not every night. Last drink was yesterday. (Husband reported may be she is minimizing, she drinks more over the weekends and lately - she is passing through alot - as he mentioned).  SOCIAL HISTORY: She is married, living with her husband and she works as a Patent examiner.   ADMISSION MEDICATIONS:  Her blood pressure medication is metoprolol but she did not take this medication for the last 1 month.  Seroquel 150 mg in the morning and 300 at night. Vicodin 5/500 one q.4 to 6 hours p.r.n. for back pain.   ALLERGIES: No known drug allergies.   PHYSICAL EXAMINATION: VITAL SIGNS: Blood pressure 164/108, respiratory rate 18, pulse 100, temperature 99, oxygen saturation 97%.  GENERAL APPEARANCE: Middle-aged female lying down in bed in no acute distress.  HEAD AND NECK: No pallor. No icterus. No cyanosis. ENT: Normal hearing, no discharge. No lesions. Nasal mucosa was normal. No bleeding. No discharge. No ulcers. Oral examination:  She does have a  little crusted blood. There is mark or tongue bite. No oral thrush.  EYES: Normal eyelids and conjunctiva.  Pupils about 4 mm,  round, equal and reactive to light. NECK:   Supple.  Trachea midline. No thyromegaly. No cervical lymphadenopathy. No masses.  HEART:  Normal S1, S2.  No S3 or S4.  No murmur, no gallop.  No carotid bruits.   LUNGS: Normal breathing pattern without use of  accessory muscles. No rales. No wheezing.  ABDOMEN: Soft without tenderness. No hepatosplenomegaly. No masses. No hernias.  SKIN: No ulcers. No subcutaneous nodules. MUSCULOSKELETAL: No joint swelling.  No clubbing. NEUROLOGIC:   Cranial nerves II through XII are intact.  No focal motor deficit. Sense of touch is preserved.   PSYCHIATRIC: The patient is alert and oriented x 3. Mood and affect were normal.   LABORATORY AND DIAGNOSTIC DATA:  CAT scan of the head results are pending but on my personal review, I do not see any bleed and no gross acute abnormalities.  Her  EKG showed sinus tachycardia at rate of 119 per minute, otherwise unremarkable EKG. Serum glucose 96, BUN 6, creatinine 0.7, sodium 138, potassium 3.9, calcium 8.5. Normal liver function tests and transaminases. TSH 1.8.  CBC was normal with a white count of 4000, hemoglobin 14, hematocrit 42, platelet count 200. Urinalysis was unremarkable.   ASSESSMENT: 1.  Grand mal seizure activity first episode occurred x 2.  2.  Systemic hypertension, uncontrolled.  The patient started on medication a month ago.  3.  Resolving gastroenteritis.  4.  Tobacco abuse and possibly alcohol abuse.   PLAN: We will admit the patient for observation and seizure precautions.  Drinking 2 glasses of wine at night does not appear to be factor in her seizure, unless she is minimizing as the husband indicates. I will check her magnesium level. IV hydration.  Neuro checks and also neurology consultation with Dr. Sherryll Burger. Resume metoprolol and follow-up on blood pressure. Check official report of CAT scan of the head.   Time spent evaluating this patient: More than 50 minutes.   ____________________________ Carney Corners. Rudene Re, MD amd:ct D: 11/07/2012 04:20:41 ET T: 11/07/2012 10:30:36 ET JOB#: [id number]  cc: Carney Corners. Rudene Re, MD, <Dictator> Zollie Scale MD ELECTRONICALLY SIGNED 11/09/2012 22:46

## 2018-11-01 DIAGNOSIS — R918 Other nonspecific abnormal finding of lung field: Secondary | ICD-10-CM

## 2018-11-01 HISTORY — DX: Other nonspecific abnormal finding of lung field: R91.8

## 2018-11-01 HISTORY — PX: OTHER SURGICAL HISTORY: SHX169

## 2018-12-28 ENCOUNTER — Emergency Department (HOSPITAL_COMMUNITY)
Admission: EM | Admit: 2018-12-28 | Discharge: 2018-12-28 | Disposition: A | Discharge status: Home / Self Care | Payer: Self-pay | Attending: Emergency Medicine | Admitting: Emergency Medicine

## 2018-12-28 ENCOUNTER — Encounter (HOSPITAL_COMMUNITY): Payer: Self-pay | Admitting: Emergency Medicine

## 2018-12-28 ENCOUNTER — Emergency Department (HOSPITAL_COMMUNITY): Payer: Self-pay

## 2018-12-28 DIAGNOSIS — Z7982 Long term (current) use of aspirin: Secondary | ICD-10-CM | POA: Insufficient documentation

## 2018-12-28 DIAGNOSIS — F17210 Nicotine dependence, cigarettes, uncomplicated: Secondary | ICD-10-CM | POA: Insufficient documentation

## 2018-12-28 DIAGNOSIS — I10 Essential (primary) hypertension: Secondary | ICD-10-CM | POA: Insufficient documentation

## 2018-12-28 DIAGNOSIS — J449 Chronic obstructive pulmonary disease, unspecified: Secondary | ICD-10-CM | POA: Insufficient documentation

## 2018-12-28 DIAGNOSIS — R410 Disorientation, unspecified: Secondary | ICD-10-CM | POA: Insufficient documentation

## 2018-12-28 DIAGNOSIS — Z79899 Other long term (current) drug therapy: Secondary | ICD-10-CM | POA: Insufficient documentation

## 2018-12-28 HISTORY — DX: Chronic obstructive pulmonary disease, unspecified: J44.9

## 2018-12-28 LAB — CBC
HCT: 43.8 % (ref 36.0–46.0)
Hemoglobin: 13.6 g/dL (ref 12.0–15.0)
MCH: 29.6 pg (ref 26.0–34.0)
MCHC: 31.1 g/dL (ref 30.0–36.0)
MCV: 95.2 fL (ref 80.0–100.0)
Platelets: 235 10*3/uL (ref 150–400)
RBC: 4.6 MIL/uL (ref 3.87–5.11)
RDW: 12.6 % (ref 11.5–15.5)
WBC: 5.7 10*3/uL (ref 4.0–10.5)
nRBC: 0 % (ref 0.0–0.2)

## 2018-12-28 LAB — COMPREHENSIVE METABOLIC PANEL
ALT: 31 U/L (ref 0–44)
AST: 38 U/L (ref 15–41)
Albumin: 3.6 g/dL (ref 3.5–5.0)
Alkaline Phosphatase: 77 U/L (ref 38–126)
Anion gap: 10 (ref 5–15)
BUN: 5 mg/dL — ABNORMAL LOW (ref 6–20)
CO2: 30 mmol/L (ref 22–32)
Calcium: 9.1 mg/dL (ref 8.9–10.3)
Chloride: 100 mmol/L (ref 98–111)
Creatinine, Ser: 0.88 mg/dL (ref 0.44–1.00)
GFR calc Af Amer: >60 mL/min (ref >60)
GFR calc non Af Amer: >60 mL/min (ref >60)
Glucose, Bld: 91 mg/dL (ref 70–99)
Potassium: 4.7 mmol/L (ref 3.5–5.1)
Sodium: 140 mmol/L (ref 135–145)
Total Bilirubin: 0.5 mg/dL (ref 0.3–1.2)
Total Protein: 6.7 g/dL (ref 6.5–8.1)

## 2018-12-28 LAB — I-STAT BETA HCG BLOOD, ED (MC, WL, AP ONLY): I-stat hCG, quantitative: <5 m[IU]/mL (ref <5)

## 2018-12-28 LAB — URINALYSIS, ROUTINE W REFLEX MICROSCOPIC
Bilirubin Urine: NEGATIVE
GLUCOSE, UA: NEGATIVE mg/dL
Hgb urine dipstick: NEGATIVE
KETONES UR: 5 mg/dL — AB
Nitrite: NEGATIVE
Protein, ur: NEGATIVE mg/dL
Specific Gravity, Urine: 1.014 (ref 1.005–1.030)
pH: 5 (ref 5.0–8.0)

## 2018-12-28 LAB — ETHANOL: Alcohol, Ethyl (B): <10 mg/dL (ref <10)

## 2018-12-28 LAB — RAPID URINE DRUG SCREEN, HOSP PERFORMED
Amphetamines: NOT DETECTED
Barbiturates: NOT DETECTED
Benzodiazepines: POSITIVE — AB
Cocaine: NOT DETECTED
Opiates: POSITIVE — AB
Tetrahydrocannabinol: NOT DETECTED

## 2018-12-28 LAB — AMMONIA: Ammonia: 15 umol/L (ref 9–35)

## 2018-12-28 MED ORDER — SODIUM CHLORIDE 0.9% FLUSH
3.0000 mL | Freq: Once | INTRAVENOUS | Status: AC
  Administered 2018-12-28: 3 mL via INTRAVENOUS

## 2018-12-28 MED ORDER — CEPHALEXIN 500 MG PO CAPS: 500.0000 mg | ORAL_CAPSULE | Freq: Four times a day (QID) | ORAL | 0 refills | Status: DC

## 2018-12-28 MED ORDER — SODIUM CHLORIDE 0.9 % IV BOLUS
500.0000 mL | Freq: Once | INTRAVENOUS | Status: AC
  Administered 2018-12-28: 500 mL via INTRAVENOUS

## 2018-12-28 NOTE — ED Triage Notes (Signed)
Pt states she has been having more forgetfulness. Pt's friends telling her she is getting days/ conversations confused. Pt denies n/v/d/cp/sob

## 2018-12-28 NOTE — ED Notes (Signed)
Patient transported to CT 

## 2018-12-28 NOTE — ED Provider Notes (Signed)
[facility] EMERGENCY DEPARTMENT Provider Note   CSN: [id number] Arrival date & time: 12/28/18  1417    History   Chief Complaint Chief Complaint  Patient presents with  . Altered Mental Status    HPI Jessica Moran is a 58 y.o. female.     58 year old female with prior medical history as detailed below presents for evaluation of confusion.  Patient reports several months of intermittent confusion.  This appears to be a chronic issue for this patient.  She reports that she has seen multiple providers for her same complaint.  She denies recent acute changes in her symptoms.  She reports that on some days she will "forget things."  She is currently alert and oriented x4.  She is able to answer all questions appropriately.  She denies any associated visual change, slurred speech, focal weakness, or other complaint.  She denies associated fever.  She denies chest pain or shortness of breath.  The history is provided by the patient and medical records.  Altered Mental Status  Presenting symptoms: confusion   Severity:  Mild Most recent episode:  Yesterday Episode history:  Continuous Duration:  3 months Timing:  Constant Progression:  Waxing and waning Chronicity:  Chronic   Past Medical History:  Diagnosis Date  . COPD (chronic obstructive pulmonary disease) (HCC)   . Hypertension   . Menopause   . Tobacco abuse   . Tobacco abuse counseling     Patient Active Problem List   Diagnosis Date Noted  . Hypertension 11/11/2012  . Lacunar infarct, acute (HCC) 11/11/2012  . Seizure, epileptic (HCC) 11/09/2012  . Osteoarthritis of hip 07/19/2012  . 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 Menopause   . Tobacco abuse   . Post traumatic stress disorder (PTSD) 09/07/2011  . Tobacco abuse counseling     Past Surgical History:  Procedure Laterality Date  . LAPAROSCOPIC SUPRACERVICAL HYSTERECTOMY  1998   [place]  . SPINE SURGERY       OB History     No obstetric history on file.      Home Medications    Prior to Admission medications   Medication Sig Start Date End Date Taking? Authorizing Provider  ALPRAZolam (XANAX) 1 MG tablet TAKE ONE-HALF TABLET BY MOUTH EVERY MORNING AND 1 TABLET BY MOUTH EVERY NIGHT AT BEDTIME AS NEEDED FOR SLEEP 04/18/13   Sherlene Shams, MD  aspirin 81 MG tablet Take 81 mg by mouth daily.    [provider]  escitalopram (LEXAPRO) 20 MG tablet Take 1 tablet (20 mg total) by mouth daily. 12/27/12 03/27/13  Sherlene Shams, MD  HYDROcodone-acetaminophen (NORCO) 7.5-325 MG per tablet TAKE 1 TABLET TWICE DAILY AS NEEDED FOR PAIN 06/03/13   Sherlene Shams, MD  Hydrocodone-Acetaminophen 7.5-300 MG TABS Take 1 tablet by mouth 2 (two) times daily as needed. 11/09/12   Sherlene Shams, MD  lamoTRIgine (LAMICTAL) 25 MG tablet Take 25 mg by mouth 2 (two) times daily.    [provider]  metoprolol succinate (TOPROL-XL) 25 MG 24 hr tablet Take 1 tablet (25 mg total) by mouth daily. 11/09/12   Sherlene Shams, MD  METOPROLOL TARTRATE PO Take 25 mg by mouth daily.    [provider]  Multiple Vitamin (MULTIVITAMIN) tablet Take 1 tablet by mouth daily.    [provider]  pravastatin (PRAVACHOL) 20 MG tablet Take 1 tablet (20 mg total) by mouth daily. 11/10/12   Sherlene Shams, MD  QUEtiapine (SEROQUEL) 300 MG tablet Take 1 tablet (300 mg total) by mouth daily. 01/04/13 02/03/13  Sherlene Shams, MD  QUEtiapine (SEROQUEL) 300 MG tablet TAKE 1 TABLET EVERY DAY 04/18/13   Sherlene Shams, MD    Family History Family History  Problem Relation Age of Onset  . Stroke Neg Hx   . Hearing loss Neg Hx     Social History Social History   Tobacco Use  . Smoking status: Current Every Day Smoker    Packs/day: 0.50    Types: Cigarettes  . Smokeless tobacco: Never Used  Substance Use Topics  . Alcohol use: Yes  . Drug use: Yes     Allergies   Ibuprofen   Review of Systems Review of Systems   Psychiatric/Behavioral: Positive for confusion.  All other systems reviewed and are negative.    Physical Exam Updated Vital Signs BP 108/79 (BP Location: Right Arm)   Pulse 94   Temp 97.8 F (36.6 C) (Oral)   Resp 18   SpO2 92%   Physical Exam Vitals signs and nursing note reviewed.  Constitutional:      General: She is not in acute distress.    Appearance: She is well-developed.  HENT:     Head: Normocephalic and atraumatic.  Eyes:     Conjunctiva/sclera: Conjunctivae normal.     Pupils: Pupils are equal, round, and reactive to light.  Neck:     Musculoskeletal: Normal range of motion and neck supple.  Cardiovascular:     Rate and Rhythm: Normal rate and regular rhythm.     Heart sounds: Normal heart sounds.  Pulmonary:     Effort: Pulmonary effort is normal. No respiratory distress.     Breath sounds: Normal breath sounds.  Abdominal:     General: There is no distension.     Palpations: Abdomen is soft.     Tenderness: There is no abdominal tenderness.  Musculoskeletal: Normal range of motion.        General: No deformity.  Skin:    General: Skin is warm and dry.  Neurological:     General: No focal deficit present.     Mental Status: She is alert and oriented to person, place, and time. Mental status is at baseline.     Cranial Nerves: No cranial nerve deficit.     Sensory: No sensory deficit.     Motor: No weakness.     Coordination: Coordination normal.  Psychiatric:        Mood and Affect: Mood normal.      ED Treatments / Results  Labs (all labs ordered are listed, but only abnormal results are displayed) Labs Reviewed  COMPREHENSIVE METABOLIC PANEL - Abnormal; Notable for the following components:      Result Value   BUN 5 (*)    All other components within normal limits  URINALYSIS, ROUTINE W REFLEX MICROSCOPIC - Abnormal; Notable for the following components:   APPearance HAZY (*)    Ketones, ur 5 (*)    Leukocytes,Ua MODERATE (*)     Bacteria, UA RARE (*)    All other components within normal limits  RAPID URINE DRUG SCREEN, HOSP PERFORMED - Abnormal; Notable for the following components:   Opiates POSITIVE (*)    Benzodiazepines POSITIVE (*)    All other components within normal limits  CBC  ETHANOL  AMMONIA  I-STAT BETA HCG BLOOD, ED ([facility], [facility], [facility] ONLY)  CBG MONITORING, ED    EKG None  Radiology  Ct Head Wo Contrast  Result Date: 12/28/2018 CLINICAL DATA:  Altered level of consciousness EXAM: CT HEAD WITHOUT CONTRAST TECHNIQUE: Contiguous axial images were obtained from the base of the skull through the vertex without intravenous contrast. COMPARISON:  None. FINDINGS: Brain: Mild cerebral atrophy. No acute intracranial abnormality. Specifically, no hemorrhage, hydrocephalus, mass lesion, acute infarction, or significant intracranial injury. Vascular: No hyperdense vessel or unexpected calcification. Skull: No acute calvarial abnormality. Sinuses/Orbits: Visualized paranasal sinuses and mastoids clear. Orbital soft tissues unremarkable. Other: None IMPRESSION: Mild atrophy.  No acute intracranial abnormality. Electronically Signed   By: Charlett Nose M.D.   On: 12/28/2018 18:47    Procedures Procedures (including critical care time)  Medications Ordered in ED Medications  sodium chloride flush (NS) 0.9 % injection 3 mL (3 mLs Intravenous Given 12/28/18 1928)  sodium chloride 0.9 % bolus 500 mL (500 mLs Intravenous New Bag/Given 12/28/18 1928)     Initial Impression / Assessment and Plan / ED Course  I have reviewed the triage vital signs and the nursing notes.  Pertinent labs & imaging results that were available during my care of the patient were reviewed by me and considered in my medical decision making (see chart for details).        MDM  Screen complete  Patient is presenting for evaluation of confusion.  This appears to be a chronic issue with this patient.  Work-up in the ED does not reveal  significant evidence of acute pathology.  CT imaging of the brain does not reveal acute pathology.  Basic labs on the whole are without significant abnormality.  Patient does have a UA that is suggestive of UTI.  Patient does not have significant symptoms of UTI however.  Patient's confusion is most likely secondary to polypharmacy.  Her medication list includes Seroquel, benzodiazepines, and narcotics.  I am concerned that this may be the underlying reason for her chronic confusion.  Patient and family understand the need for close follow-up.  Strict return precautions given and understood.  Patient does not appear to be appropriate for admission at this time. Final Clinical Impressions(s) / ED Diagnoses   Final diagnoses:  Confusion    ED Discharge Orders    None       Wynetta Fines, MD 12/28/18 2039

## 2018-12-28 NOTE — Discharge Instructions (Addendum)
Return for any problem.  Follow-up with your regular care providers as instructed.  Your symptoms today may be secondary to some of the medications that you are taking.  It is important for you to review these medications with your regular care provider.

## 2018-12-28 NOTE — ED Notes (Signed)
Reviewed d/c instructions with pt, who verbalized understanding and had no outstanding questions. Pt departed in NAD with personal oxygen tank, "service" dog, and other belongings with her or significant other. Escorted to lobby in wheelchair by this RN.

## 2019-07-03 DIAGNOSIS — J189 Pneumonia, unspecified organism: Secondary | ICD-10-CM

## 2019-07-03 HISTORY — DX: Pneumonia, unspecified organism: J18.9

## 2019-08-20 ENCOUNTER — Other Ambulatory Visit: Payer: Self-pay

## 2019-08-20 ENCOUNTER — Emergency Department
Admission: EM | Admit: 2019-08-20 | Discharge: 2019-08-20 | Disposition: A | Discharge status: Home / Self Care | Payer: Self-pay | Attending: Emergency Medicine | Admitting: Emergency Medicine

## 2019-08-20 DIAGNOSIS — F329 Major depressive disorder, single episode, unspecified: Secondary | ICD-10-CM | POA: Insufficient documentation

## 2019-08-20 DIAGNOSIS — I10 Essential (primary) hypertension: Secondary | ICD-10-CM | POA: Insufficient documentation

## 2019-08-20 DIAGNOSIS — Z7289 Other problems related to lifestyle: Secondary | ICD-10-CM | POA: Insufficient documentation

## 2019-08-20 DIAGNOSIS — F4323 Adjustment disorder with mixed anxiety and depressed mood: Secondary | ICD-10-CM | POA: Insufficient documentation

## 2019-08-20 DIAGNOSIS — Z23 Encounter for immunization: Secondary | ICD-10-CM | POA: Insufficient documentation

## 2019-08-20 DIAGNOSIS — F4322 Adjustment disorder with anxiety: Secondary | ICD-10-CM | POA: Insufficient documentation

## 2019-08-20 DIAGNOSIS — F1910 Other psychoactive substance abuse, uncomplicated: Secondary | ICD-10-CM | POA: Insufficient documentation

## 2019-08-20 DIAGNOSIS — R45851 Suicidal ideations: Secondary | ICD-10-CM | POA: Insufficient documentation

## 2019-08-20 DIAGNOSIS — J449 Chronic obstructive pulmonary disease, unspecified: Secondary | ICD-10-CM | POA: Insufficient documentation

## 2019-08-20 DIAGNOSIS — F17210 Nicotine dependence, cigarettes, uncomplicated: Secondary | ICD-10-CM | POA: Insufficient documentation

## 2019-08-20 DIAGNOSIS — Z8673 Personal history of transient ischemic attack (TIA), and cerebral infarction without residual deficits: Secondary | ICD-10-CM | POA: Insufficient documentation

## 2019-08-20 HISTORY — DX: Anxiety disorder, unspecified: F41.9

## 2019-08-20 HISTORY — DX: Unspecified convulsions: R56.9

## 2019-08-20 HISTORY — DX: Post-traumatic stress disorder, unspecified: F43.10

## 2019-08-20 HISTORY — DX: Cerebral infarction, unspecified: I63.9

## 2019-08-20 LAB — URINE DRUG SCREEN, QUALITATIVE (ARMC ONLY)
Amphetamines, Ur Screen: NOT DETECTED
Barbiturates, Ur Screen: NOT DETECTED
Benzodiazepine, Ur Scrn: NOT DETECTED
Cannabinoid 50 Ng, Ur ~~LOC~~: NOT DETECTED
Cocaine Metabolite,Ur ~~LOC~~: NOT DETECTED
MDMA (Ecstasy)Ur Screen: NOT DETECTED
Methadone Scn, Ur: NOT DETECTED
Opiate, Ur Screen: NOT DETECTED
Phencyclidine (PCP) Ur S: NOT DETECTED
Tricyclic, Ur Screen: POSITIVE — AB

## 2019-08-20 LAB — CBC
HCT: 41.3 % (ref 36.0–46.0)
Hemoglobin: 13.2 g/dL (ref 12.0–15.0)
MCH: 29.1 pg (ref 26.0–34.0)
MCHC: 32 g/dL (ref 30.0–36.0)
MCV: 91.2 fL (ref 80.0–100.0)
Platelets: 331 10*3/uL (ref 150–400)
RBC: 4.53 MIL/uL (ref 3.87–5.11)
RDW: 13.3 % (ref 11.5–15.5)
WBC: 6.8 10*3/uL (ref 4.0–10.5)
nRBC: 0 % (ref 0.0–0.2)

## 2019-08-20 LAB — COMPREHENSIVE METABOLIC PANEL
ALT: 63 U/L — ABNORMAL HIGH (ref 0–44)
AST: 32 U/L (ref 15–41)
Albumin: 4.2 g/dL (ref 3.5–5.0)
Alkaline Phosphatase: 94 U/L (ref 38–126)
Anion gap: 9 (ref 5–15)
BUN: 12 mg/dL (ref 6–20)
CO2: 30 mmol/L (ref 22–32)
Calcium: 9.9 mg/dL (ref 8.9–10.3)
Chloride: 101 mmol/L (ref 98–111)
Creatinine, Ser: 0.56 mg/dL (ref 0.44–1.00)
GFR calc Af Amer: >60 mL/min (ref >60)
GFR calc non Af Amer: >60 mL/min (ref >60)
Glucose, Bld: 98 mg/dL (ref 70–99)
Potassium: 4.3 mmol/L (ref 3.5–5.1)
Sodium: 140 mmol/L (ref 135–145)
Total Bilirubin: 0.5 mg/dL (ref 0.3–1.2)
Total Protein: 7.9 g/dL (ref 6.5–8.1)

## 2019-08-20 LAB — ACETAMINOPHEN LEVEL: Acetaminophen (Tylenol), Serum: <10 ug/mL — ABNORMAL LOW (ref 10–30)

## 2019-08-20 LAB — ETHANOL: Alcohol, Ethyl (B): <10 mg/dL (ref <10)

## 2019-08-20 LAB — SALICYLATE LEVEL: Salicylate Lvl: <7 mg/dL (ref 2.8–30.0)

## 2019-08-20 MED ORDER — TETANUS-DIPHTH-ACELL PERTUSSIS 5-2.5-18.5 LF-MCG/0.5 IM SUSP
0.5000 mL | Freq: Once | INTRAMUSCULAR | Status: AC
  Administered 2019-08-20: 0.5 mL via INTRAMUSCULAR
  Filled 2019-08-20: qty 0.5

## 2019-08-20 NOTE — ED Notes (Signed)
Patient's son is here to pick his mother's dog and the dog belongings at this time.

## 2019-08-20 NOTE — ED Notes (Signed)
Patient in room with her service dog and blanket at this time.

## 2019-08-20 NOTE — ED Notes (Signed)
Patient states uses 02 prn and does not need it now. Patient belongings retrieved to get ready for discharge. Patient contacting Ladona Mow for ride home.

## 2019-08-20 NOTE — ED Notes (Signed)
Suitcase with clothes, blue back pack with clothes, chargers, cellphone, and pink wallet. Pillow and 2 blankets, and dog belongings are with the QUAD rover, O2 concentrator with patient.

## 2019-08-20 NOTE — ED Notes (Signed)
BEHAVIORAL HEALTH ROUNDING Patient sleeping: No. Patient alert and oriented: yes Behavior appropriate: Yes.  ; If no, describe:  Nutrition and fluids offered: Yes  Toileting and hygiene offered: Yes  Sitter present: not applicable Law enforcement present: Yes  

## 2019-08-20 NOTE — ED Triage Notes (Addendum)
Pt to ER because she states she is feeling depressed, states she has been cutting her forearms with scissors. Pt arrives with her service dog, Surveyor, quantity Tiffany aware. Pt states that she had COVID in March, was on a ventilator and lost her house and car which "was final straw". Brought to ER by son. Pt tearful, anxious. Pt arrives with oxygen pulsator and states she has to keep it.   Pt will be dressed out by Lonn Georgia, ED Tech.

## 2019-08-20 NOTE — BH Assessment (Signed)
Assessment Note  Jessica Moran is an 58 y.o. female who presents to ED with increased anxiety and depressive sxs. Patient was very tearful during assessment while alert and oriented x4. Pt reports being linked to [facility] for outpatient mental health treatment. Pt reports her children withhold her medications so that she will not abuse her prescribed medications. She showed this writer superficial cuts to her right forearm. She reports she cut herself to help "take away the pain she feels in her heart". Pt has an appointment scheduled with the psychiatrist at [facility] on August 30, 2019. Pt recently lost her home, car, and marriage per her report. Pt denied SI/HI/AVH.  Diagnosis: Adjustment Disorder, with anxiety  Past Medical History:  Past Medical History:  Diagnosis Date  . Anxiety   . COPD (chronic obstructive pulmonary disease) ([place])   . Hypertension   . Menopause   . MI (myocardial infarction) ([place])   . PTSD (post-traumatic stress disorder)   . Seizures ([place])   . Stroke ([place])   . Tobacco abuse   . Tobacco abuse counseling     Past Surgical History:  Procedure Laterality Date  . [place]  . SPINE SURGERY      Family History:  Family History  Problem Relation Age of Onset  . Stroke Neg Hx   . Hearing loss Neg Hx     Social History:  reports that she has been smoking cigarettes. She has been smoking about 0.50 packs per day. She has never used smokeless tobacco. She reports current alcohol use. She reports current drug use.  Additional Social History:  Alcohol / Drug Use Pain Medications: See MAR Prescriptions: See MAR Over the Counter: See MAR History of alcohol / drug use?: No history of alcohol / drug abuse Longest period of sobriety (when/how long): UKN  CIWA: CIWA-Ar BP: (!) 146/95 Pulse Rate: (!) 130 COWS:    Allergies:  Allergies  Allergen Reactions  . Ibuprofen     Home Medications: (Not in a hospital  admission)   OB/GYN Status:  No LMP recorded. Patient has had a hysterectomy.  General Assessment Data Location [facility] Assessment: [facility] [address] ED TTS Assessment: In system Is this a Tele or Face-to-Face Assessment?: Face-to-Face Is this an Initial Assessment or a Re-assessment for this encounter?: Initial Assessment Patient Accompanied by:: N/A Language Other than English: No Living Arrangements: Other (Comment) What gender do you identify as?: Female Marital status: Divorced Maiden name: Myer Haff Pregnancy Status: No Living Arrangements: Children Can pt return to current living arrangement?: Yes Admission Status: Voluntary Is patient capable of signing voluntary admission?: Yes Referral Source: Self/Family/Friend Insurance type: None  Medical Screening Exam [facility] Walk-in ONLY) Medical Exam completed: Yes  Crisis Care Plan Living Arrangements: Children Legal Guardian: Other:(Self) Name of Psychiatrist: [facility] Name of Therapist: [facility]  Education Status Is patient currently in school?: No Is the patient employed, unemployed or receiving disability?: Unemployed  Risk to self with the past 6 months Suicidal Ideation: No Has patient been a risk to self within the past 6 months prior to admission? : No Suicidal Intent: No Has patient had any suicidal intent within the past 6 months prior to admission? : No Is patient at risk for suicide?: No Suicidal Plan?: No Has patient had any suicidal plan within the past 6 months prior to admission? : No Access to Means: No What has been your use of drugs/alcohol within the last 12 months?: None Reported Previous Attempts/Gestures: No How  many times?: 0 Other Self Harm Risks: None Triggers for Past Attempts: None known Intentional Self Injurious Behavior: Cutting Comment - Self Injurious Behavior: Cuts to right forearm Family Suicide History: No Recent stressful life event(s): Conflict (Comment), Divorce, Loss (Comment), Financial Problems Persecutory  voices/beliefs?: No Depression: Yes Depression Symptoms: Despondent, Insomnia, Tearfulness, Isolating, Fatigue, Guilt, Loss of interest in usual pleasures, Feeling worthless/self pity Substance abuse history and/or treatment for substance abuse?: No Suicide prevention information given to non-admitted patients: Not applicable  Risk to Others within the past 6 months Homicidal Ideation: No Does patient have any lifetime risk of violence toward others beyond the six months prior to admission? : No Thoughts of Harm to Others: No Current Homicidal Intent: No Current Homicidal Plan: No Access to Homicidal Means: No Identified Victim: None History of harm to others?: No Assessment of Violence: None Noted Violent Behavior Description: None Does patient have access to weapons?: No Criminal Charges Pending?: No Does patient have a court date: No Is patient on probation?: No  Psychosis Hallucinations: None noted Delusions: None noted  Mental Status Report Appearance/Hygiene: In scrubs Eye Contact: Fair Motor Activity: Freedom of movement Speech: Logical/coherent Level of Consciousness: Crying, Alert Mood: Depressed, Anxious Affect: Anxious, Depressed Anxiety Level: Moderate Thought Processes: Coherent, Relevant Judgement: Unimpaired Orientation: Person, Place, Time, Situation, Appropriate for developmental age Obsessive Compulsive Thoughts/Behaviors: Minimal  Cognitive Functioning Concentration: Normal Memory: Recent Intact, Remote Intact Is patient IDD: No Insight: Fair Impulse Control: Poor Appetite: Good Have you had any weight changes? : No Change Sleep: Decreased Total Hours of Sleep: 4 Vegetative Symptoms: None  ADLScreening [facility] Assessment Services) Patient's cognitive ability adequate to safely complete daily activities?: Yes Patient able to express need for assistance with ADLs?: Yes Independently performs ADLs?: Yes (appropriate for developmental age)  Prior  Inpatient Therapy Prior Inpatient Therapy: No  Prior Outpatient Therapy Prior Outpatient Therapy: No Does patient have an ACCT team?: No Does patient have Intensive In-House Services?  : No Does patient have [facility] services? : No Does patient have P4CC services?: No  ADL Screening (condition at time of admission) Patient's cognitive ability adequate to safely complete daily activities?: Yes Patient able to express need for assistance with ADLs?: Yes Independently performs ADLs?: Yes (appropriate for developmental age)       Abuse/Neglect Assessment (Assessment to be complete while patient is alone) Abuse/Neglect Assessment Can Be Completed: Yes Physical Abuse: Denies Verbal Abuse: Denies Sexual Abuse: Denies Exploitation of patient/patient's resources: Denies Self-Neglect: Denies Values / Beliefs Cultural Requests During Hospitalization: None Spiritual Requests During Hospitalization: None Consults Spiritual Care Consult Needed: No Social Work Consult Needed: No Merchant navy officer (For Healthcare) Does Patient Have a Medical Advance Directive?: No Nutrition Screen- [facility] Adult/WL/AP Patient's home diet: Regular     Child/Adolescent Assessment Running Away Risk: (Patient is an adult)  Disposition:  Disposition Initial Assessment Completed for this Encounter: Yes Disposition of Patient: Discharge Patient refused recommended treatment: No Mode of transportation if patient is discharged/movement?: Car Patient referred to: Outpatient clinic referral  On Site Evaluation by:   Reviewed with Physician:    Wilmon Arms 08/20/2019 5:33 PM

## 2019-08-20 NOTE — ED Notes (Signed)
Introducing myself to patient and her dog was growling as I spoke to patient.

## 2019-08-20 NOTE — ED Notes (Signed)
BEHAVIORAL HEALTH ROUNDING Patient sleeping: Yes.   Patient alert and oriented: yes Behavior appropriate: Yes.  ; If no, describe:  Nutrition and fluids offered: Yes  Toileting and hygiene offered: Yes  Sitter present: not applicable Law enforcement present: Yes  

## 2019-08-20 NOTE — ED Notes (Signed)

## 2019-08-20 NOTE — Consult Note (Signed)
[facility]   Reason for Consult: Anxiety Referring Physician: Dr. Mayford KnifeWilliams Patient Identification: Jessica Moran MRN:  [id number] Principal Diagnosis: <principal problem not specified> Diagnosis:  Active Problems:   * No active hospital problems. *   Total Time spent with patient: 45 minutes  Subjective:   Jessica Moran is a 58 y.o. female patient admitted with anxiety.  HPI: Patient is a 58 year old female with a history of depression and anxiety who presents with symptoms of anxiety in the context of social stressors in the home.  Patient states that she feels anxious since moving with her daughter.  Patient has been in marital trouble since February of this year.  Recently patient and husband decided to split which means patient want to sell her house.  Patient didn't have a place to live and went to live with her daughter.  Patient has superficial scratch marks on her arm from cutting herself.  Patient states that this was not a way to end her life, rather a way for her to cope with the stress.  Patient states that she also has a lot of stress at her daughter's house due to the 7 grandchildren.  Patient also reports that her children have been cutting down on her benzo medication.  Patient [facility] on the 29th of this month.  She recognizes her need to get help and is willing to follow-up on an outpatient basis.  She denies any suicidal ideation.  She denies any homicidal ideation.  Denies any psychotic symptoms.  Collateral was obtained from both her son and her daughter.  Both son and daughter acknowledge that mom has psychiatric issues but deny that she is a danger to herself or others at this time.  They report limiting her medication so as for her not to take more than the prescribed dosage.  They report mom had substance abuse problems in the past they would want to see her struggle with that again.  Daughter made  aware of plan for him to seek outpatient care.  Daughter in agreement and willing to help mom attend appointments.  Past Psychiatric History: Patient has a history of 1 prior psychiatric mission approximately 16 years ago for suicidal ideation.  Patient has been receiving outpatient therapy at her home but has recently came to live with her daughter.  Patient is still in contact with her therapist through tele-sessions.  Risk to Self: Suicidal Ideation: (P) No Suicidal Intent: (P) No Is patient at risk for suicide?: (P) No Suicidal Plan?: (P) No Access to Means: (P) No What has been your use of drugs/alcohol within the last 12 months?: (P) None Reported How many times?: (P) 0 Other Self Harm Risks: (P) None Triggers for Past Attempts: (P) None known Intentional Self Injurious Behavior: (P) Cutting Risk to Others:  no Prior Inpatient Therapy:  yes Prior Outpatient Therapy:  yes  Past Medical History:  Past Medical History:  Diagnosis Date  . Anxiety   . COPD (chronic obstructive pulmonary disease) ([facility])   . Hypertension   . Menopause   . MI (myocardial infarction) ([facility])   . PTSD (post-traumatic stress disorder)   . Seizures ([facility])   . Stroke ([facility])   . Tobacco abuse   . Tobacco abuse counseling     Past Surgical History:  Procedure Laterality Date  . LAPAROSCOPIC SUPRACERVICAL HYSTERECTOMY  1998   [place]  . SPINE SURGERY     Family History:  Family History  Problem Relation Age of Onset  . Stroke Neg Hx   . Hearing loss Neg Hx    Family Psychiatric  History: Denies Social History:  Social History   Substance and Sexual Activity  Alcohol Use Yes     Social History   Substance and Sexual Activity  Drug Use Yes    Social History   Socioeconomic History  . Marital status: Single    Spouse name: Not on file  . Number of children: Not on file  . Years of education: Not on file  . Highest education level: Not on file  Occupational History  . Not on file   Social Needs  . Financial resource strain: Not on file  . Food insecurity    Worry: Not on file    Inability: Not on file  . Transportation needs    Medical: Not on file    Non-medical: Not on file  Tobacco Use  . Smoking status: Current Every Day Smoker    Packs/day: 0.50    Types: Cigarettes  . Smokeless tobacco: Never Used  Substance and Sexual Activity  . Alcohol use: Yes  . Drug use: Yes  . Sexual activity: Not on file  Lifestyle  . Physical activity    Days per week: Not on file    Minutes per session: Not on file  . Stress: Not on file  Relationships  . Social Musician on phone: Not on file    Gets together: Not on file    Attends religious service: Not on file    Active member of club or organization: Not on file    Attends meetings of clubs or organizations: Not on file    Relationship status: Not on file  Other Topics Concern  . Not on file  Social History Narrative  . Not on file   Additional Social History: Patient lives with her daughter.  Patient used to work as a Engineer, civil (consulting).  Patient gets enjoyment out of spending time with her dog and her grandchildren.  Patient denies substance use at this time.    Allergies:   Allergies  Allergen Reactions  . Ibuprofen     Labs:  Results for orders placed or performed during the hospital encounter of 08/20/19 (from the past 48 hour(s))  Comprehensive metabolic panel     Status: Abnormal   Collection Time: 08/20/19 10:27 AM  Result Value Ref Range   Sodium 140 135 - 145 mmol/L   Potassium 4.3 3.5 - 5.1 mmol/L   Chloride 101 98 - 111 mmol/L   CO2 30 22 - 32 mmol/L   Glucose, Bld 98 70 - 99 mg/dL   BUN 12 6 - 20 mg/dL   Creatinine, Ser 5.49 0.44 - 1.00 mg/dL   Calcium 9.9 8.9 - 82.6 mg/dL   Total Protein 7.9 6.5 - 8.1 g/dL   Albumin 4.2 3.5 - 5.0 g/dL   AST 32 15 - 41 U/L   ALT 63 (H) 0 - 44 U/L   Alkaline Phosphatase 94 38 - 126 U/L   Total Bilirubin 0.5 0.3 - 1.2 mg/dL   GFR calc non Af Amer >60  >60 mL/min   GFR calc Af Amer >60 >60 mL/min   Anion gap 9 5 - 15    Comment: Performed at [facility], [address] [place], [place] [address]  Ethanol     Status: None   Collection Time: 08/20/19 10:27 AM  Result Value Ref Range  Alcohol, Ethyl (B) <10 <10 mg/dL    Comment: (NOTE) Lowest detectable limit for serum alcohol is 10 mg/dL. For medical purposes only. Performed at [facility], [address] [place], [place] [address]   Salicylate level     Status: None   Collection Time: 08/20/19 10:27 AM  Result Value Ref Range   Salicylate Lvl <7.0 2.8 - 30.0 mg/dL    Comment: Performed at [facility], [address] [place], [place] [address]  Acetaminophen level     Status: Abnormal   Collection Time: 08/20/19 10:27 AM  Result Value Ref Range   Acetaminophen (Tylenol), Serum <10 (L) 10 - 30 ug/mL    Comment: (NOTE) Therapeutic concentrations vary significantly. A range of 10-30 ug/mL  may be an effective concentration for many patients. However, some  are best treated at concentrations outside of this range. Acetaminophen concentrations >150 ug/mL at 4 hours after ingestion  and >50 ug/mL at 12 hours after ingestion are often associated with  toxic reactions. Performed at [facility], [address] [place], [place] [address]   cbc     Status: None   Collection Time: 08/20/19 10:27 AM  Result Value Ref Range   WBC 6.8 4.0 - 10.5 K/uL   RBC 4.53 3.87 - 5.11 MIL/uL   Hemoglobin 13.2 12.0 - 15.0 g/dL   HCT 75.9 16.3 - 84.6 %   MCV 91.2 80.0 - 100.0 fL   MCH 29.1 26.0 - 34.0 pg   MCHC 32.0 30.0 - 36.0 g/dL   RDW 65.9 93.5 - 70.1 %   Platelets 331 150 - 400 K/uL   nRBC 0.0 0.0 - 0.2 %    Comment: Performed at [facility], [address], [place], [place] [address]  Urine Drug Screen, Qualitative     Status: Abnormal   Collection Time: 08/20/19 10:27 AM  Result Value Ref Range   Tricyclic, Ur Screen POSITIVE (A)  NONE DETECTED   Amphetamines, Ur Screen NONE DETECTED NONE DETECTED   MDMA (Ecstasy)Ur Screen NONE DETECTED NONE DETECTED   Cocaine Metabolite,Ur [place] NONE DETECTED NONE DETECTED   Opiate, Ur Screen NONE DETECTED NONE DETECTED   Phencyclidine (PCP) Ur S NONE DETECTED NONE DETECTED   Cannabinoid 50 Ng, Ur [place] NONE DETECTED NONE DETECTED   Barbiturates, Ur Screen NONE DETECTED NONE DETECTED   Benzodiazepine, Ur Scrn NONE DETECTED NONE DETECTED   Methadone Scn, Ur NONE DETECTED NONE DETECTED    Comment: (NOTE) Tricyclics + metabolites, urine    Cutoff 1000 ng/mL Amphetamines + metabolites, urine  Cutoff 1000 ng/mL MDMA (Ecstasy), urine              Cutoff 500 ng/mL Cocaine Metabolite, urine          Cutoff 300 ng/mL Opiate + metabolites, urine        Cutoff 300 ng/mL Phencyclidine (PCP), urine         Cutoff 25 ng/mL Cannabinoid, urine                 Cutoff 50 ng/mL Barbiturates + metabolites, urine  Cutoff 200 ng/mL Benzodiazepine, urine              Cutoff 200 ng/mL Methadone, urine                   Cutoff 300 ng/mL The urine drug screen provides only a preliminary, unconfirmed analytical test result and should not be used for non-medical purposes. Clinical consideration and professional  judgment should be applied to any positive drug screen result due to possible interfering substances. A more specific alternate chemical method must be used in order to obtain a confirmed analytical result. Gas chromatography / mass spectrometry (GC/MS) is the preferred confirmat ory method. Performed at [facility], [address] [place], [place] [id number]     No current facility-administered medications for this encounter.    Current Outpatient Medications  Medication Sig Dispense Refill  . ALPRAZolam (XANAX) 1 MG tablet TAKE ONE-HALF TABLET BY MOUTH EVERY MORNING AND 1 TABLET BY MOUTH EVERY NIGHT AT BEDTIME AS NEEDED FOR SLEEP 45 tablet 0  . aspirin 81 MG tablet Take 81 mg by  mouth daily.    . cephALEXin (KEFLEX) 500 MG capsule Take 1 capsule (500 mg total) by mouth 4 (four) times daily. (Patient not taking: Reported on 08/20/2019) 20 capsule 0  . escitalopram (LEXAPRO) 20 MG tablet Take 1 tablet (20 mg total) by mouth daily. 30 tablet 3  . HYDROcodone-acetaminophen (NORCO) 7.5-325 MG per tablet TAKE 1 TABLET TWICE DAILY AS NEEDED FOR PAIN (Patient not taking: Reported on 08/20/2019) 60 tablet 0  . Hydrocodone-Acetaminophen 7.5-300 MG TABS Take 1 tablet by mouth 2 (two) times daily as needed. (Patient not taking: Reported on 08/20/2019) 60 each 5  . lamoTRIgine (LAMICTAL) 25 MG tablet Take 25 mg by mouth 2 (two) times daily.    . metoprolol succinate (TOPROL-XL) 25 MG 24 hr tablet Take 1 tablet (25 mg total) by mouth daily. 90 tablet 3  . METOPROLOL TARTRATE PO Take 25 mg by mouth daily.    . Multiple Vitamin (MULTIVITAMIN) tablet Take 1 tablet by mouth daily.    . pravastatin (PRAVACHOL) 20 MG tablet Take 1 tablet (20 mg total) by mouth daily. 90 tablet 3  . QUEtiapine (SEROQUEL) 300 MG tablet Take 1 tablet (300 mg total) by mouth daily. 30 tablet 3  . QUEtiapine (SEROQUEL) 300 MG tablet TAKE 1 TABLET EVERY DAY 30 tablet 1    Musculoskeletal: Strength & Muscle Tone: within normal limits Gait & Station: normal Patient leans: N/A  Psychiatric Specialty Exam: Physical Exam  Review of Systems  Constitutional: Negative for fever.  HENT: Negative for hearing loss.   Eyes: Negative for blurred vision.  Cardiovascular: Negative for chest pain.  Skin: Negative for rash.  Psychiatric/Behavioral: Positive for depression. Negative for hallucinations, substance abuse and suicidal ideas. The patient is nervous/anxious.     Blood pressure (!) 146/95, pulse (!) 130, temperature 98.5 F (36.9 C), temperature source Oral, resp. rate (!) 22, height 5\' 3"  (1.6 m), weight 77.1 kg, SpO2 98 %.Body mass index is 30.11 kg/m.  General Appearance: Fairly Groomed  Eye Contact:   Fair  Speech:  Normal Rate  Volume:  Normal  Mood:  Anxious  Affect:  Full Range  Thought Process:  Coherent  Orientation:  Full (Time, Place, and Person)  Thought Content:  Logical  Suicidal Thoughts:  No  Homicidal Thoughts:  No  Memory:  Negative  Judgement:  Intact  Insight:  Fair  Psychomotor Activity:  Normal  Concentration:  Concentration: Good  Recall:  Good  Fund of Knowledge:  Good  Language:  Good  Akathisia:  No  Handed:  Right  AIMS (if indicated):     Assets:  Communication Skills Desire for Improvement Physical Health Social Support Talents/Skills  ADL's:  Intact  Cognition:  WNL  Sleep:        Treatment Plan Summary: 58 year old female with history  of depression anxiety who presents with symptoms of anxiety in the context of social stressors.  At this time patient is willing and able to seek outpatient help.  She does not represent any danger to herself or others and therefore is better suited by care in the community.  Discharge with outpatient follow-up.  Diagnosis: Adjustment disorder   Disposition: No evidence of imminent risk to self or others at present.   Patient does not meet criteria for psychiatric inpatient admission. Discussed crisis plan, support from social network, calling 911, coming to the Emergency Department, and calling Suicide Hotline.  Dixie Dials, MD 08/20/2019 5:29 PM

## 2019-08-20 NOTE — ED Notes (Signed)
Patient switched to 02 in room at 3l. Patient tank put in locked room with her belongings.

## 2019-08-20 NOTE — ED Provider Notes (Signed)
4:15 PM Patient has been seen and evaluated by psychiatry team and deemed appropriate for discharge. Patient determined not to be a threat to themselves or others.   The lacerations to her bilateral forearms are very superficial and do not require repair, additionally they have already started to scab over.  She thinks her last tetanus shot was about 8 years ago, will update and then plan for discharge. Patient is otherwise medically clear.   Will plan for discharge with outpatient follow up.  Patient is agreeable with the plan.    Lilia Pro., MD 08/20/19 [phone]

## 2019-08-20 NOTE — ED Notes (Signed)
Patient states her dog is for comfort and he's potty trained !

## 2019-08-20 NOTE — ED Provider Notes (Signed)
[facility] Emergency Department Provider Note       Time seen: ----------------------------------------- 10:57 AM on 08/20/2019 -----------------------------------------   I have reviewed the triage vital signs and the nursing notes.  HISTORY   Chief Complaint Suicidal    HPI Jessica Moran is a 58 y.o. female with a history of anxiety, COPD, hypertension, MI, PTSD, seizures, stroke who presents to the ED for self-harm.  Patient states she has been feeling depressed.  She has been cutting her forearms with scissors.  She states she had Covid in March, was on a ventilator and lost her house and car which was her final straw.  She was brought to the ER by her son.  Past Medical History:  Diagnosis Date  . Anxiety   . COPD (chronic obstructive pulmonary disease) (HCC)   . Hypertension   . Menopause   . MI (myocardial infarction) (HCC)   . PTSD (post-traumatic stress disorder)   . Seizures (HCC)   . Stroke (HCC)   . Tobacco abuse   . Tobacco abuse counseling     Patient Active Problem List   Diagnosis Date Noted  . Hypertension 11/11/2012  . Lacunar infarct, acute (HCC) 11/11/2012  . Seizure, epileptic (HCC) 11/09/2012  . Osteoarthritis of hip 07/19/2012  . 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 Menopause   . Tobacco abuse   . Post traumatic stress disorder (PTSD) 09/07/2011  . Tobacco abuse counseling     Past Surgical History:  Procedure Laterality Date  . LAPAROSCOPIC SUPRACERVICAL HYSTERECTOMY  1998   [place]  . SPINE SURGERY      Allergies Ibuprofen  Social History Social History   Tobacco Use  . Smoking status: Current Every Day Smoker    Packs/day: 0.50    Types: Cigarettes  . Smokeless tobacco: Never Used  Substance Use Topics  . Alcohol use: Yes  . Drug use: Yes   Review of Systems Constitutional: Negative for fever. Cardiovascular: Negative for chest pain. Respiratory: Negative for shortness of  breath. Gastrointestinal: Negative for abdominal pain, vomiting and diarrhea. Musculoskeletal: Negative for back pain. Skin: Numerous superficial lacerations are appreciated on both forearms Neurological: Negative for headaches, focal weakness or numbness. Psychiatric: Positive for self-inflicted lacerations All systems negative/normal/unremarkable except as stated in the HPI  ____________________________________________   PHYSICAL EXAM:  VITAL SIGNS: ED Triage Vitals [08/20/19 1023]  Enc Vitals Group     BP (!) 146/95     Pulse Rate (!) 130     Resp (!) 22     Temp 98.5 F (36.9 C)     Temp Source Oral     SpO2 98 %     Weight 170 lb (77.1 kg)     Height 5\' 3"  (1.6 m)     Head Circumference      Peak Flow      Pain Score 8     Pain Loc      Pain Edu?      Excl. in GC?    Constitutional: Alert and oriented.  No distress Eyes: Conjunctivae are normal. Normal extraocular movements. ENT      Head: Normocephalic and atraumatic.      Nose: No congestion/rhinnorhea.      Mouth/Throat: Mucous membranes are moist.      Neck: No stridor. Cardiovascular: Normal rate, regular rhythm. No murmurs, rubs, or gallops. Respiratory: Normal respiratory effort without tachypnea nor retractions. Breath sounds are clear and equal bilaterally. No wheezes/rales/rhonchi. Gastrointestinal: Soft and nontender.  Normal bowel sounds Musculoskeletal: Nontender with normal range of motion in extremities. No lower extremity tenderness nor edema. Neurologic:  Normal speech and language. No gross focal neurologic deficits are appreciated.  Skin: Numerous superficial lacerations are appreciated on both forearms Psychiatric: Mood and affect are normal. Speech and behavior are normal.  ____________________________________________  ED COURSE:  As part of my medical decision making, I reviewed the following data within the [place] History obtained from family if available, nursing  notes, old chart and ekg, as well as notes from prior ED visits. Patient presented for depression and self-harm, we will assess with labs as indicated at this time.   Procedures  Jessica Moran was evaluated in Emergency Department on 08/20/2019 for the symptoms described in the history of present illness. She was evaluated in the context of the global COVID-19 pandemic, which necessitated consideration that the patient might be at risk for infection with the SARS-CoV-2 virus that causes COVID-19. Institutional protocols and algorithms that pertain to the evaluation of patients at risk for COVID-19 are in a state of rapid change based on information released by regulatory bodies including the CDC and federal and state organizations. These policies and algorithms were followed during the patient's care in the ED.  ____________________________________________   LABS (pertinent positives/negatives)  Labs Reviewed  COMPREHENSIVE METABOLIC PANEL - Abnormal; Notable for the following components:      Result Value   ALT 63 (*)    All other components within normal limits  ACETAMINOPHEN LEVEL - Abnormal; Notable for the following components:   Acetaminophen (Tylenol), Serum <10 (*)    All other components within normal limits  URINE DRUG SCREEN, QUALITATIVE ([facility] ONLY) - Abnormal; Notable for the following components:   Tricyclic, Ur Screen POSITIVE (*)    All other components within normal limits  ETHANOL  SALICYLATE LEVEL  CBC  ___________________________________________   DIFFERENTIAL DIAGNOSIS   Depression, substance abuse, suicidal ideation, borderline personality disorder, malingering  FINAL ASSESSMENT AND PLAN  Depression, cutting   Plan: The patient had presented for feelings of depression with cutting. Patient's labs are unremarkable, she appears medically clear for psychiatric evaluation and disposition.Laurence Aly, MD    Note: This note was generated in  part or whole with voice recognition software. Voice recognition is usually quite accurate but there are transcription errors that can and very often do occur. I apologize for any typographical errors that were not detected and corrected.     Earleen Newport, MD 08/20/19 [phone]

## 2019-08-20 NOTE — ED Notes (Signed)
Son came and took dog.

## 2019-08-22 ENCOUNTER — Inpatient Hospital Stay
Admission: EM | Admit: 2019-08-22 | Discharge: 2019-08-29 | DRG: 446 | Disposition: A | Discharge status: Home / Self Care | Payer: Self-pay | Attending: Surgery | Admitting: Surgery

## 2019-08-22 ENCOUNTER — Other Ambulatory Visit: Payer: Self-pay

## 2019-08-22 ENCOUNTER — Emergency Department: Payer: Self-pay

## 2019-08-22 DIAGNOSIS — K810 Acute cholecystitis: Secondary | ICD-10-CM

## 2019-08-22 DIAGNOSIS — Z7982 Long term (current) use of aspirin: Secondary | ICD-10-CM

## 2019-08-22 DIAGNOSIS — I252 Old myocardial infarction: Secondary | ICD-10-CM

## 2019-08-22 DIAGNOSIS — Z87891 Personal history of nicotine dependence: Secondary | ICD-10-CM

## 2019-08-22 DIAGNOSIS — F329 Major depressive disorder, single episode, unspecified: Secondary | ICD-10-CM | POA: Diagnosis present

## 2019-08-22 DIAGNOSIS — Z886 Allergy status to analgesic agent status: Secondary | ICD-10-CM

## 2019-08-22 DIAGNOSIS — R1011 Right upper quadrant pain: Secondary | ICD-10-CM

## 2019-08-22 DIAGNOSIS — G40909 Epilepsy, unspecified, not intractable, without status epilepticus: Secondary | ICD-10-CM | POA: Diagnosis present

## 2019-08-22 DIAGNOSIS — K8000 Calculus of gallbladder with acute cholecystitis without obstruction: Principal | ICD-10-CM | POA: Diagnosis present

## 2019-08-22 DIAGNOSIS — Z20828 Contact with and (suspected) exposure to other viral communicable diseases: Secondary | ICD-10-CM | POA: Diagnosis present

## 2019-08-22 DIAGNOSIS — K8020 Calculus of gallbladder without cholecystitis without obstruction: Secondary | ICD-10-CM

## 2019-08-22 DIAGNOSIS — J449 Chronic obstructive pulmonary disease, unspecified: Secondary | ICD-10-CM | POA: Diagnosis present

## 2019-08-22 DIAGNOSIS — Z79899 Other long term (current) drug therapy: Secondary | ICD-10-CM

## 2019-08-22 DIAGNOSIS — F4310 Post-traumatic stress disorder, unspecified: Secondary | ICD-10-CM | POA: Diagnosis present

## 2019-08-22 DIAGNOSIS — Z8673 Personal history of transient ischemic attack (TIA), and cerebral infarction without residual deficits: Secondary | ICD-10-CM

## 2019-08-22 DIAGNOSIS — Z90711 Acquired absence of uterus with remaining cervical stump: Secondary | ICD-10-CM

## 2019-08-22 DIAGNOSIS — I10 Essential (primary) hypertension: Secondary | ICD-10-CM | POA: Diagnosis present

## 2019-08-22 LAB — CBC WITH DIFFERENTIAL/PLATELET
Abs Immature Granulocytes: 0.02 10*3/uL (ref 0.00–0.07)
Basophils Absolute: 0.1 10*3/uL (ref 0.0–0.1)
Basophils Relative: 1 %
Eosinophils Absolute: 0.5 10*3/uL (ref 0.0–0.5)
Eosinophils Relative: 5 %
HCT: 40 % (ref 36.0–46.0)
Hemoglobin: 12.7 g/dL (ref 12.0–15.0)
Immature Granulocytes: 0 %
Lymphocytes Relative: 31 %
Lymphs Abs: 2.8 10*3/uL (ref 0.7–4.0)
MCH: 29.2 pg (ref 26.0–34.0)
MCHC: 31.8 g/dL (ref 30.0–36.0)
MCV: 92 fL (ref 80.0–100.0)
Monocytes Absolute: 0.7 10*3/uL (ref 0.1–1.0)
Monocytes Relative: 8 %
Neutro Abs: 4.9 10*3/uL (ref 1.7–7.7)
Neutrophils Relative %: 55 %
Platelets: 270 10*3/uL (ref 150–400)
RBC: 4.35 MIL/uL (ref 3.87–5.11)
RDW: 13.5 % (ref 11.5–15.5)
WBC: 8.9 10*3/uL (ref 4.0–10.5)
nRBC: 0 % (ref 0.0–0.2)

## 2019-08-22 LAB — COMPREHENSIVE METABOLIC PANEL
ALT: 45 U/L — ABNORMAL HIGH (ref 0–44)
AST: 26 U/L (ref 15–41)
Albumin: 4 g/dL (ref 3.5–5.0)
Alkaline Phosphatase: 81 U/L (ref 38–126)
Anion gap: 8 (ref 5–15)
BUN: 13 mg/dL (ref 6–20)
CO2: 28 mmol/L (ref 22–32)
Calcium: 9.3 mg/dL (ref 8.9–10.3)
Chloride: 103 mmol/L (ref 98–111)
Creatinine, Ser: 0.64 mg/dL (ref 0.44–1.00)
GFR calc Af Amer: >60 mL/min (ref >60)
GFR calc non Af Amer: >60 mL/min (ref >60)
Glucose, Bld: 127 mg/dL — ABNORMAL HIGH (ref 70–99)
Potassium: 4.2 mmol/L (ref 3.5–5.1)
Sodium: 139 mmol/L (ref 135–145)
Total Bilirubin: 0.4 mg/dL (ref 0.3–1.2)
Total Protein: 7.8 g/dL (ref 6.5–8.1)

## 2019-08-22 LAB — LIPASE, BLOOD: Lipase: 21 U/L (ref 11–51)

## 2019-08-22 MED ORDER — IOHEXOL 300 MG/ML  SOLN
100.0000 mL | Freq: Once | INTRAMUSCULAR | Status: AC | PRN
  Administered 2019-08-22: 100 mL via INTRAVENOUS

## 2019-08-22 NOTE — ED Provider Notes (Signed)
[facility] Emergency Department Provider Note   First MD Initiated Contact with Patient 08/22/19 2305     (approximate)  I have reviewed the triage vital signs and the nursing notes.   HISTORY  Chief Complaint Abdominal Pain   HPI Jessica Moran is a 58 y.o. female with below listed previous medical conditions presents to the emergency department via EMS secondary to acute onset of upper abdominal pain which was 10 out of 10 at the time of onset per the patient.  Patient states that the pain began last night and was nonradiating in nature.  Patient denies any associated nausea vomiting diarrhea or constipation.  Patient denies any associated fever.   Patient was given 100 mcg of fentanyl in route to the emergency department by EMS.  Patient states that her current pain score is 0 out of 10.  Patient has no complaints at present.  Patient denies any EtOH ingestion.  Patient denies any illicit drug use.  Patient denies any suicidal or homicidal ideation.        Past Medical History:  Diagnosis Date  . Anxiety   . COPD (chronic obstructive pulmonary disease) (HCC)   . Hypertension   . Menopause   . MI (myocardial infarction) (HCC)   . PTSD (post-traumatic stress disorder)   . Seizures (HCC)   . Stroke (HCC)   . Tobacco abuse   . Tobacco abuse counseling     Patient Active Problem List   Diagnosis Date Noted  . Acute cholecystitis 08/23/2019  . Adjustment disorder with mixed anxiety and depressed mood   . Hypertension 11/11/2012  . Lacunar infarct, acute (HCC) 11/11/2012  . Seizure, epileptic (HCC) 11/09/2012  . Osteoarthritis of hip 07/19/2012  . 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 Menopause   . Tobacco abuse   . Post traumatic stress disorder (PTSD) 09/07/2011  . Tobacco abuse counseling     Past Surgical History:  Procedure Laterality Date  . LAPAROSCOPIC SUPRACERVICAL HYSTERECTOMY  1998   [place]  . SPINE SURGERY       Prior to Admission medications   Medication Sig Start Date End Date Taking? Authorizing Provider  ALPRAZolam (XANAX) 1 MG tablet TAKE ONE-HALF TABLET BY MOUTH EVERY MORNING AND 1 TABLET BY MOUTH EVERY NIGHT AT BEDTIME AS NEEDED FOR SLEEP 04/18/13   Sherlene Shams, MD  aspirin 81 MG tablet Take 81 mg by mouth daily.    [provider]  cephALEXin (KEFLEX) 500 MG capsule Take 1 capsule (500 mg total) by mouth 4 (four) times daily. Patient not taking: Reported on 08/20/2019 12/28/18   Wynetta Fines, MD  escitalopram (LEXAPRO) 20 MG tablet Take 1 tablet (20 mg total) by mouth daily. 12/27/12 03/27/13  Sherlene Shams, MD  HYDROcodone-acetaminophen (NORCO) 7.5-325 MG per tablet TAKE 1 TABLET TWICE DAILY AS NEEDED FOR PAIN Patient not taking: Reported on 08/20/2019 06/03/13   Sherlene Shams, MD  Hydrocodone-Acetaminophen 7.5-300 MG TABS Take 1 tablet by mouth 2 (two) times daily as needed. Patient not taking: Reported on 08/20/2019 11/09/12   Sherlene Shams, MD  lamoTRIgine (LAMICTAL) 25 MG tablet Take 25 mg by mouth 2 (two) times daily.    [provider]  metoprolol succinate (TOPROL-XL) 25 MG 24 hr tablet Take 1 tablet (25 mg total) by mouth daily. 11/09/12   Sherlene Shams, MD  METOPROLOL TARTRATE PO Take 25 mg by mouth daily.    [provider]  Multiple Vitamin (MULTIVITAMIN)  tablet Take 1 tablet by mouth daily.    [provider]  pravastatin (PRAVACHOL) 20 MG tablet Take 1 tablet (20 mg total) by mouth daily. 11/10/12   Sherlene Shams, MD  QUEtiapine (SEROQUEL) 300 MG tablet Take 1 tablet (300 mg total) by mouth daily. 01/04/13 02/03/13  Sherlene Shams, MD  QUEtiapine (SEROQUEL) 300 MG tablet TAKE 1 TABLET EVERY DAY 04/18/13   Sherlene Shams, MD    Allergies Ibuprofen  Family History  Problem Relation Age of Onset  . Stroke Neg Hx   . Hearing loss Neg Hx     Social History Social History   Tobacco Use  . Smoking status: Current Every Day Smoker     Packs/day: 0.50    Types: Cigarettes  . Smokeless tobacco: Never Used  Substance Use Topics  . Alcohol use: Yes  . Drug use: Yes    Review of Systems Constitutional: No fever/chills Eyes: No visual changes. ENT: No sore throat. Cardiovascular: Denies chest pain. Respiratory: Denies shortness of breath. Gastrointestinal: No abdominal pain.  No nausea, no vomiting.  No diarrhea.  No constipation. Genitourinary: Negative for dysuria. Musculoskeletal: Negative for neck pain.  Negative for back pain. Integumentary: Negative for rash. Neurological: Negative for headaches, focal weakness or numbness. Psychiatric:  Negative for suicidal/homicidal ideation.   ____________________________________________   PHYSICAL EXAM:  VITAL SIGNS: ED Triage Vitals  Enc Vitals Group     BP 08/22/19 2302 109/78     Pulse Rate 08/22/19 2302 (!) 112     Resp 08/22/19 2302 14     Temp 08/22/19 2302 97.7 F (36.5 C)     Temp Source 08/22/19 2302 Oral     SpO2 08/22/19 2302 96 %     Weight 08/22/19 2303 75.3 kg (166 lb)     Height 08/22/19 2303 1.6 m (5\' 3" )     Head Circumference --      Peak Flow --      Pain Score 08/22/19 2303 0     Pain Loc --      Pain Edu? --      Excl. in GC? --     Constitutional: Alert and oriented.  Eyes: Conjunctivae are normal.  Mouth/Throat: Patient is wearing a mask. Neck: No stridor.  No meningeal signs.   Cardiovascular: Normal rate, regular rhythm. Good peripheral circulation. Grossly normal heart sounds. Respiratory: Normal respiratory effort.  No retractions. Gastrointestinal: Right upper quadrant tenderness to palpation.  No distention.  Musculoskeletal: No lower extremity tenderness nor edema. No gross deformities of extremities. Neurologic:  Normal speech and language. No gross focal neurologic deficits are appreciated.  Skin:  Skin is warm, dry and intact. Psychiatric: Mood and affect are normal. Speech and behavior are normal.   ____________________________________________   LABS (all labs ordered are listed, but only abnormal results are displayed)  Labs Reviewed  COMPREHENSIVE METABOLIC PANEL - Abnormal; Notable for the following components:      Result Value   Glucose, Bld 127 (*)    ALT 45 (*)    All other components within normal limits  CBC WITH DIFFERENTIAL/PLATELET  LIPASE, BLOOD  ETHANOL  URINALYSIS, COMPLETE (UACMP) WITH MICROSCOPIC   ____________________________________________  EKG  ED ECG REPORT I, [place] N Demika Langenderfer, the attending physician, personally viewed and interpreted this ECG.   Date: 08/23/2019  EKG Time: 10:57 PM  Rate: 114  Rhythm: Sinus tachycardia  Axis: Normal  Intervals: Normal  ST&T Change: None  ____________________________________________  RADIOLOGY I, [place]  N Elvie Palomo, personally viewed and evaluated these images (plain radiographs) as part of my medical decision making, as well as reviewing the written report by the radiologist.  ED MD interpretation: CT revealed gallstones with possible gallbladder wall thickening concerning for cholecystitis which was confirmed on ultrasound per radiologist.  Official radiology report(s): Ct Abdomen Pelvis W Contrast  Result Date: 08/23/2019 CLINICAL DATA:  Generalized acute abdominal pain. EXAM: CT ABDOMEN AND PELVIS WITH CONTRAST TECHNIQUE: Multidetector CT imaging of the abdomen and pelvis was performed using the standard protocol following bolus administration of intravenous contrast. CONTRAST:  175mL OMNIPAQUE IOHEXOL 300 MG/ML  SOLN COMPARISON:  None. FINDINGS: Lower chest: Dependent atelectasis. Streaky opacity in the anterior right middle lobe favors atelectasis or scarring. Heart is normal in size. Bilateral breast implants noted, partially included. No evidence of rupture or pericapsular edema. Hepatobiliary: No focal hepatic abnormality. Lobulated noncalcified densities in the gallbladder suspicious for gallstones. There  is gallbladder wall thickening of 6 mm. No biliary dilatation. Pancreas: No ductal dilatation or inflammation. Spleen: Normal in size without focal abnormality. Splenule anteriorly. Adrenals/Urinary Tract: Normal adrenal glands. No hydronephrosis or perinephric edema. Homogeneous renal enhancement with symmetric excretion on delayed phase imaging. Urinary bladder is physiologically distended without wall thickening. Stomach/Bowel: Tiny hiatal hernia. Stomach is fluid-filled without gastric wall thickening. No small bowel obstruction or inflammatory change. Normal appendix. Moderate volume of stool in the colon. No colonic wall thickening or inflammation. No significant diverticular disease. Vascular/Lymphatic: Moderate aortic atherosclerosis without aneurysm. Patent portal vein. No adenopathy. Reproductive: Status post hysterectomy. No adnexal masses. Other: No free air, free fluid, or intra-abdominal fluid collection. Musculoskeletal: There are no acute or suspicious osseous abnormalities. Facet hypertrophy in the lower lumbar spine. IMPRESSION: 1. Probable gallstones. Gallbladder wall thickening raises concern for acute cholecystitis. Recommend correlation with right upper quadrant ultrasound. 2. Moderate volume of stool throughout the colon suggesting constipation. Aortic Atherosclerosis (ICD10-I70.0). Electronically Signed   By: Keith Rake M.D.   On: 08/23/2019 00:27   US Abdomen Limited Ruq  Result Date: 08/23/2019 CLINICAL DATA:  Right upper quadrant pain EXAM: ULTRASOUND ABDOMEN LIMITED RIGHT UPPER QUADRANT COMPARISON:  August 22, 2019 FINDINGS: Gallbladder: Layering calcified gallstones are present the largest measuring 1.1 cm. There is a 9 mm shadowing calculus that is nonmobile seen at the gallbladder neck. There is diffuse gallbladder wall thickening measuring up to 7.5 mm. Common bile duct: Diameter: 5.1 mm Liver: No focal lesion identified. Within normal limits in parenchymal echogenicity.  Portal vein is patent on color Doppler imaging with normal direction of blood flow towards the liver. Other: None. IMPRESSION: Findings suggestive of acute cholecystitis with a 9 mm calculus seen in the gallbladder neck. Electronically Signed   By: Prudencio Pair M.D.   On: 08/23/2019 03:29      Procedures   ____________________________________________   INITIAL IMPRESSION / MDM / [place] / ED COURSE  As part of my medical decision making, I reviewed the following data within the electronic MEDICAL RECORD NUMBER  58 year old female presenting with above-stated history and physical exam concerning for possible cholelithiasis cholecystitis diverticulitis less likely appendicitis.  A such CT scan was initially performed which reveal evidence of cholelithiasis with possible cholecystitis which was confirmed on ultrasound.  Patient was given 1 g IV ceftriaxone.  Patient discussed with Dr. Lysle Pearl who will admit the patient for further management.  ____________________________________________  FINAL CLINICAL IMPRESSION(S) / ED DIAGNOSES  Final diagnoses:  RUQ pain  Acute cholecystitis     MEDICATIONS  GIVEN DURING THIS VISIT:  Medications  cefTRIAXone (ROCEPHIN) 1 g in sodium chloride 0.9 % 100 mL IVPB (has no administration in time range)  iohexol (OMNIPAQUE) 300 MG/ML solution 100 mL (100 mLs Intravenous Contrast Given 08/22/19 2349)     ED Discharge Orders    None      *Please note:  Damita DunningsLeona King Willetts was evaluated in Emergency Department on 08/23/2019 for the symptoms described in the history of present illness. She was evaluated in the context of the global COVID-19 pandemic, which necessitated consideration that the patient might be at risk for infection with the SARS-CoV-2 virus that causes COVID-19. Institutional protocols and algorithms that pertain to the evaluation of patients at risk for COVID-19 are in a state of rapid change based on information released by  regulatory bodies including the CDC and federal and state organizations. These policies and algorithms were followed during the patient's care in the ED.  Some ED evaluations and interventions may be delayed as a result of limited staffing during the pandemic.*  Note:  This document was prepared using Dragon voice recognition software and may include unintentional dictation errors.   Darci CurrentBrown, [place] N, MD 08/23/19 [phone]

## 2019-08-22 NOTE — ED Triage Notes (Signed)
Patient arrived from home by [facility]. Patient had abd area with pain radiating across stomach. Patient denies constipation or N/V/D.  Patient states she has breast implants from 20 plus years ago and is concern that is causing abd pain. Patient received 100 of fentyl prior to arrival. Patient falling asleep during assessment.

## 2019-08-22 NOTE — ED Notes (Signed)
Patient transported to CT scan . 

## 2019-08-23 ENCOUNTER — Emergency Department: Payer: Self-pay

## 2019-08-23 DIAGNOSIS — K810 Acute cholecystitis: Secondary | ICD-10-CM | POA: Diagnosis present

## 2019-08-23 LAB — URINALYSIS, COMPLETE (UACMP) WITH MICROSCOPIC
Bilirubin Urine: NEGATIVE
Glucose, UA: NEGATIVE mg/dL
Hgb urine dipstick: NEGATIVE
Ketones, ur: NEGATIVE mg/dL
Nitrite: NEGATIVE
Protein, ur: NEGATIVE mg/dL
Specific Gravity, Urine: 1.043 — ABNORMAL HIGH (ref 1.005–1.030)
pH: 5 (ref 5.0–8.0)

## 2019-08-23 LAB — ETHANOL: Alcohol, Ethyl (B): <10 mg/dL (ref <10)

## 2019-08-23 LAB — SARS CORONAVIRUS 2 (TAT 6-24 HRS): SARS Coronavirus 2: NEGATIVE

## 2019-08-23 LAB — HEPATIC FUNCTION PANEL
ALT: 46 U/L — ABNORMAL HIGH (ref 0–44)
AST: 30 U/L (ref 15–41)
Albumin: 4.1 g/dL (ref 3.5–5.0)
Alkaline Phosphatase: 90 U/L (ref 38–126)
Bilirubin, Direct: 0.2 mg/dL (ref 0.0–0.2)
Indirect Bilirubin: 0.5 mg/dL (ref 0.3–0.9)
Total Bilirubin: 0.7 mg/dL (ref 0.3–1.2)
Total Protein: 8.1 g/dL (ref 6.5–8.1)

## 2019-08-23 LAB — HIV ANTIBODY (ROUTINE TESTING W REFLEX): HIV Screen 4th Generation wRfx: NONREACTIVE

## 2019-08-23 MED ORDER — HYDROCODONE-ACETAMINOPHEN 5-325 MG PO TABS
1.0000 | ORAL_TABLET | ORAL | Status: DC | PRN
  Administered 2019-08-23 (×2): 1 via ORAL
  Administered 2019-08-24 – 2019-08-29 (×10): 2 via ORAL
  Filled 2019-08-23 (×3): qty 2
  Filled 2019-08-23: qty 1
  Filled 2019-08-23 (×2): qty 2
  Filled 2019-08-23: qty 1
  Filled 2019-08-23 (×7): qty 2

## 2019-08-23 MED ORDER — DOCUSATE SODIUM 100 MG PO CAPS: 100.0000 mg | ORAL_CAPSULE | Freq: Two times a day (BID) | ORAL | Status: DC | PRN

## 2019-08-23 MED ORDER — ONDANSETRON HCL 4 MG/2ML IJ SOLN
4.0000 mg | Freq: Four times a day (QID) | INTRAMUSCULAR | Status: DC | PRN
  Administered 2019-08-24 – 2019-08-28 (×11): 4 mg via INTRAVENOUS
  Filled 2019-08-23 (×11): qty 2

## 2019-08-23 MED ORDER — QUETIAPINE FUMARATE 300 MG PO TABS
300.0000 mg | ORAL_TABLET | Freq: Every day | ORAL | Status: DC
  Administered 2019-08-23 – 2019-08-28 (×6): 300 mg via ORAL
  Filled 2019-08-23 (×7): qty 1

## 2019-08-23 MED ORDER — TRAMADOL HCL 50 MG PO TABS
50.0000 mg | ORAL_TABLET | Freq: Four times a day (QID) | ORAL | Status: DC | PRN
  Administered 2019-08-24 – 2019-08-28 (×5): 50 mg via ORAL
  Filled 2019-08-23 (×5): qty 1

## 2019-08-23 MED ORDER — MORPHINE SULFATE (PF) 2 MG/ML IV SOLN
2.0000 mg | INTRAVENOUS | Status: DC | PRN
  Administered 2019-08-23 – 2019-08-24 (×6): 2 mg via INTRAVENOUS
  Filled 2019-08-23 (×6): qty 1

## 2019-08-23 MED ORDER — LAMOTRIGINE 25 MG PO TABS
25.0000 mg | ORAL_TABLET | Freq: Two times a day (BID) | ORAL | Status: DC
  Administered 2019-08-23 – 2019-08-24 (×3): 25 mg via ORAL
  Filled 2019-08-23 (×3): qty 1

## 2019-08-23 MED ORDER — PRAVASTATIN SODIUM 20 MG PO TABS
20.0000 mg | ORAL_TABLET | Freq: Every day | ORAL | Status: DC
  Filled 2019-08-23 (×2): qty 1

## 2019-08-23 MED ORDER — CLONAZEPAM 1 MG PO TABS
1.0000 mg | ORAL_TABLET | Freq: Two times a day (BID) | ORAL | Status: DC | PRN
  Administered 2019-08-23 – 2019-08-28 (×3): 1 mg via ORAL
  Filled 2019-08-23 (×3): qty 1

## 2019-08-23 MED ORDER — ALPRAZOLAM 0.5 MG PO TABS: 0.5000 mg | ORAL_TABLET | Freq: Every evening | ORAL | Status: DC | PRN

## 2019-08-23 MED ORDER — LEVETIRACETAM 500 MG PO TABS
500.0000 mg | ORAL_TABLET | Freq: Two times a day (BID) | ORAL | Status: DC
  Administered 2019-08-23 – 2019-08-29 (×12): 500 mg via ORAL
  Filled 2019-08-23 (×12): qty 1

## 2019-08-23 MED ORDER — METOPROLOL SUCCINATE ER 25 MG PO TB24
25.0000 mg | ORAL_TABLET | Freq: Every day | ORAL | Status: DC
  Administered 2019-08-23 – 2019-08-29 (×7): 25 mg via ORAL
  Filled 2019-08-23 (×7): qty 1

## 2019-08-23 MED ORDER — ONDANSETRON 4 MG PO TBDP: 4.0000 mg | ORAL_TABLET | Freq: Four times a day (QID) | ORAL | Status: DC | PRN

## 2019-08-23 MED ORDER — SODIUM CHLORIDE 0.9 % IV SOLN
2.0000 g | INTRAVENOUS | Status: DC
  Administered 2019-08-24 – 2019-08-28 (×6): 2 g via INTRAVENOUS
  Filled 2019-08-23 (×4): qty 2
  Filled 2019-08-23: qty 20
  Filled 2019-08-23: qty 2
  Filled 2019-08-23: qty 20

## 2019-08-23 MED ORDER — SODIUM CHLORIDE 0.9 % IV SOLN
1.0000 g | Freq: Once | INTRAVENOUS | Status: AC
  Administered 2019-08-23: 1 g via INTRAVENOUS
  Filled 2019-08-23: qty 10

## 2019-08-23 MED ORDER — ENOXAPARIN SODIUM 40 MG/0.4ML ~~LOC~~ SOLN
40.0000 mg | SUBCUTANEOUS | Status: DC
  Administered 2019-08-23 – 2019-08-24 (×2): 40 mg via SUBCUTANEOUS
  Filled 2019-08-23 (×2): qty 0.4

## 2019-08-23 NOTE — ED Notes (Signed)
Pt resting with eyes closed.

## 2019-08-23 NOTE — ED Notes (Signed)
Pt awake and requesting to use bedpan- also states that she is in pain and would like something for it

## 2019-08-23 NOTE — ED Notes (Signed)
Report given to Tia, RN.

## 2019-08-23 NOTE — ED Notes (Signed)
Pt lights turned off

## 2019-08-23 NOTE — ED Notes (Signed)
Pt given blanket- pt taken off bedpan- urine noted on sheets- pt did not want sheets changed and requested a chuck pad be placed instead- pt still requesting pain medication

## 2019-08-23 NOTE — ED Notes (Signed)
Pt given lunch

## 2019-08-23 NOTE — ED Notes (Signed)
Pt awake and given italian ice- pt requesting pain medication

## 2019-08-23 NOTE — ED Notes (Signed)
Pt continues to rest in bed with eyes closed.

## 2019-08-23 NOTE — ED Notes (Signed)
Pt up to use bathroom 

## 2019-08-23 NOTE — ED Notes (Signed)
Pt given wet washcloth to wash face

## 2019-08-23 NOTE — ED Notes (Signed)
Patient returned from CT

## 2019-08-23 NOTE — ED Notes (Signed)
Pt still resting with eyes closed and lights off- food placed on tray next to bed- italian ice placed in freezer to prevent melting

## 2019-08-23 NOTE — ED Notes (Signed)
Pt continues to rest in bed with eyes closed- respirations even and unlabored

## 2019-08-24 LAB — BASIC METABOLIC PANEL
Anion gap: 10 (ref 5–15)
BUN: 7 mg/dL (ref 6–20)
CO2: 29 mmol/L (ref 22–32)
Calcium: 8.7 mg/dL — ABNORMAL LOW (ref 8.9–10.3)
Chloride: 101 mmol/L (ref 98–111)
Creatinine, Ser: 0.54 mg/dL (ref 0.44–1.00)
GFR calc Af Amer: >60 mL/min (ref >60)
GFR calc non Af Amer: >60 mL/min (ref >60)
Glucose, Bld: 85 mg/dL (ref 70–99)
Potassium: 4.4 mmol/L (ref 3.5–5.1)
Sodium: 140 mmol/L (ref 135–145)

## 2019-08-24 LAB — HEPATIC FUNCTION PANEL
ALT: 148 U/L — ABNORMAL HIGH (ref 0–44)
AST: 127 U/L — ABNORMAL HIGH (ref 15–41)
Albumin: 3.5 g/dL (ref 3.5–5.0)
Alkaline Phosphatase: 115 U/L (ref 38–126)
Bilirubin, Direct: 0.3 mg/dL — ABNORMAL HIGH (ref 0.0–0.2)
Indirect Bilirubin: 0.4 mg/dL (ref 0.3–0.9)
Total Bilirubin: 0.7 mg/dL (ref 0.3–1.2)
Total Protein: 6.9 g/dL (ref 6.5–8.1)

## 2019-08-24 LAB — CBC
HCT: 37.8 % (ref 36.0–46.0)
Hemoglobin: 11.6 g/dL — ABNORMAL LOW (ref 12.0–15.0)
MCH: 28.8 pg (ref 26.0–34.0)
MCHC: 30.7 g/dL (ref 30.0–36.0)
MCV: 93.8 fL (ref 80.0–100.0)
Platelets: 236 10*3/uL (ref 150–400)
RBC: 4.03 MIL/uL (ref 3.87–5.11)
RDW: 13.7 % (ref 11.5–15.5)
WBC: 5.6 10*3/uL (ref 4.0–10.5)
nRBC: 0 % (ref 0.0–0.2)

## 2019-08-24 LAB — PHOSPHORUS: Phosphorus: 4.1 mg/dL (ref 2.5–4.6)

## 2019-08-24 LAB — MAGNESIUM: Magnesium: 2.5 mg/dL — ABNORMAL HIGH (ref 1.7–2.4)

## 2019-08-24 MED ORDER — LAMOTRIGINE 100 MG PO TABS
100.0000 mg | ORAL_TABLET | Freq: Two times a day (BID) | ORAL | Status: DC
  Administered 2019-08-24 – 2019-08-29 (×10): 100 mg via ORAL
  Filled 2019-08-24 (×10): qty 1

## 2019-08-24 MED ORDER — HYDROMORPHONE HCL 1 MG/ML IJ SOLN
0.5000 mg | INTRAMUSCULAR | Status: DC | PRN
  Administered 2019-08-24 – 2019-08-25 (×3): 0.5 mg via INTRAVENOUS
  Filled 2019-08-24 (×3): qty 0.5

## 2019-08-24 MED ORDER — LACTATED RINGERS IV SOLN
INTRAVENOUS | Status: DC
  Administered 2019-08-25 – 2019-08-27 (×4): via INTRAVENOUS

## 2019-08-24 NOTE — Progress Notes (Signed)
Interventional Radiology Progress Note  Contacted by Dr. Lysle Pearl regarding possible percutaneous cholecystostomy tube placement this admission due to severe abdominal pain.   Patient had Lovenox this AM. Has taken Ultram, Vicodin and 2 mg IV morphine today. Rates pain 8/10.  No evidence of sepsis with stable VS.  Surgical cholecystectomy not possible until Monday. Percutaneous cholecystostomy not performed today due to AM Lovenox, ate breakfast and stable clinical condition.  Will sign out case to weekend call team who will check on patient this weekend in determining need for cholecystostomy or whether we can regroup Monday to determine if surgical cholecystectomy vs percutaneous drainage more appropriate.  Venetia Night. Kathlene Cote, M.D Pager:  [phone]

## 2019-08-24 NOTE — Progress Notes (Signed)
Subjective:  CC: Jessica Moran is a 58 y.o. female  Hospital stay day 1,   acute cholecystitis  HPI: No acute event overnight.  Pain still unbearable per patient.  ROS:  General: Denies weight loss, weight gain, fatigue, fevers, chills, and night sweats. Heart: Denies chest pain, palpitations, racing heart, irregular heartbeat, leg pain or swelling, and decreased activity tolerance. Respiratory: Denies breathing difficulty, shortness of breath, wheezing, cough, and sputum. GI: Denies change in appetite, heartburn, nausea, vomiting, constipation, diarrhea, and blood in stool. GU: Denies difficulty urinating, pain with urinating, urgency, frequency, blood in urine.   Objective:   Temp:  [97.9 F (36.6 C)-98.8 F (37.1 C)] 98.5 F (36.9 C) (10/23 2006) Pulse Rate:  [75-100] 91 (10/23 2006) Resp:  [18-20] 20 (10/23 2006) BP: (94-108)/(60-76) 95/69 (10/23 2006) SpO2:  [91 %-100 %] 91 % (10/23 2006)     Height: 5\' 3"  (160 cm) Weight: 75.3 kg BMI (Calculated): 29.41   Intake/Output this shift:   Intake/Output Summary (Last 24 hours) at 08/24/2019 2018 Last data filed at 08/24/2019 0900 Gross per 24 hour  Intake 643.42 ml  Output 900 ml  Net -256.58 ml    Constitutional :  alert, cooperative, appears stated age and no distress  Respiratory:  clear to auscultation bilaterally  Cardiovascular:  regular rate and rhythm  Gastrointestinal: soft, no guarding, but extreme focal tendernss in epigastric region.   Skin: Cool and moist.   Psychiatric: Normal affect, non-agitated, not confused       LABS:  CMP Latest Ref Rng & Units 08/24/2019 08/23/2019 08/22/2019  Glucose 70 - 99 mg/dL 85 - 127(H)  BUN 6 - 20 mg/dL 7 - 13  Creatinine 0.44 - 1.00 mg/dL 0.54 - 0.64  Sodium 135 - 145 mmol/L 140 - 139  Potassium 3.5 - 5.1 mmol/L 4.4 - 4.2  Chloride 98 - 111 mmol/L 101 - 103  CO2 22 - 32 mmol/L 29 - 28  Calcium 8.9 - 10.3 mg/dL 8.7(L) - 9.3  Total Protein 6.5 - 8.1 g/dL 6.9 8.1  7.8  Total Bilirubin 0.3 - 1.2 mg/dL 0.7 0.7 0.4  Alkaline Phos 38 - 126 U/L 115 90 81  AST 15 - 41 U/L 127(H) 30 26  ALT 0 - 44 U/L 148(H) 46(H) 45(H)   CBC Latest Ref Rng & Units 08/24/2019 08/22/2019 08/20/2019  WBC 4.0 - 10.5 K/uL 5.6 8.9 6.8  Hemoglobin 12.0 - 15.0 g/dL 11.6(L) 12.7 13.2  Hematocrit 36.0 - 46.0 % 37.8 40.0 41.3  Platelets 150 - 400 K/uL 236 270 331    RADS: n/a Assessment:   Acute cholecystitis Reported pain is still severe despite pain meds, and antibiotics.  Discussed possibility of perc drain as alternative since it is still too risky to proceed with lap chole.  Explained to patient how drain will remain in place for weeks, prior to the interval lap chole that will be scheduled as an outpt basis if placed.  Pt verbalized understanding and requested perc tube to be placed since she cannot stay in this much pain any longer.  I stated I will talk to the IR provider to see if possible.  NPO in the meantime.  PTSD Anxiety Seizure disorder HTN COPD

## 2019-08-24 NOTE — H&P (Addendum)
Subjective:   CC: ACUTE cholecystitis  HPI:  Jessica Moran is a 58 y.o. female who is consulted by Manson Passey for evaluation of above cc.  Symptoms were first noted a few days ago. Pain is sharp, RUQ and epigastric.  Associated with nothing specific, exacerbated by nothing specfic     Past Medical History:  has a past medical history of Anxiety, COPD (chronic obstructive pulmonary disease) (HCC), Hypertension, Menopause, MI (myocardial infarction) (HCC), PTSD (post-traumatic stress disorder), Seizures (HCC), Stroke (HCC), Tobacco abuse, and Tobacco abuse counseling.  Past Surgical History:  has a past surgical history that includes Laparoscopic supracervical hysterectomy (1998) and Spine surgery.  Family History: family history is not on file.  Social History:  reports that she quit smoking about 9 months ago. Her smoking use included cigarettes. She smoked 0.50 packs per day. She has never used smokeless tobacco. She reports current alcohol use. She reports current drug use.  Current Medications:  Medications Prior to Admission  Medication Sig Dispense Refill  . aspirin 81 MG tablet Take 162 mg by mouth 2 (two) times daily.     . clonazePAM (KLONOPIN) 1 MG tablet Take 1 mg by mouth 2 (two) times daily.    Marland Kitchen levETIRAcetam (KEPPRA) 500 MG tablet Take 500 mg by mouth 2 (two) times daily.    . pregabalin (LYRICA) 100 MG capsule Take 100 mg by mouth 2 (two) times daily.    . QUEtiapine (SEROQUEL) 200 MG tablet Take 200 mg by mouth daily.    . QUEtiapine (SEROQUEL) 400 MG tablet Take 400 mg by mouth at bedtime.      Allergies:  Allergies as of 08/22/2019 - Review Complete 08/22/2019  Allergen Reaction Noted  . Ibuprofen  09/07/2011    ROS:  General: Denies weight loss, weight gain, fatigue, fevers, chills, and night sweats. Eyes: Denies blurry vision, double vision, eye pain, itchy eyes, and tearing. Ears: Denies hearing loss, earache, and ringing in ears. Nose: Denies sinus pain,  congestion, infections, runny nose, and nosebleeds. Mouth/throat: Denies hoarseness, sore throat, bleeding gums, and difficulty swallowing. Heart: Denies chest pain, palpitations, racing heart, irregular heartbeat, leg pain or swelling, and decreased activity tolerance. Respiratory: Denies breathing difficulty, shortness of breath, wheezing, cough, and sputum. GI: Denies change in appetite, heartburn, nausea, vomiting, constipation, diarrhea, and blood in stool. GU: Denies difficulty urinating, pain with urinating, urgency, frequency, blood in urine. Musculoskeletal: Denies joint stiffness, pain, swelling, muscle weakness. Skin: Denies rash, itching, mass, tumors, sores, and boils Neurologic: Denies headache, fainting, dizziness, seizures, numbness, and tingling. Psychiatric: Denies depression, anxiety, difficulty sleeping, and memory loss. Endocrine: Denies heat or cold intolerance, and increased thirst or urination. Blood/lymph: Denies easy bruising, easy bruising, and swollen glands     Objective:     BP 95/65 (BP Location: Right Arm)   Pulse 99   Temp 98.6 F (37 C) (Oral)   Resp 18   Ht 5\' 3"  (1.6 m)   Wt 75.3 kg   SpO2 100%   BMI 29.41 kg/m    Constitutional :  alert, cooperative, appears stated age and no distress  Lymphatics/Throat:  no asymmetry, masses, or scars  Respiratory:  clear to auscultation bilaterally  Cardiovascular:  regular rate and rhythm  Gastrointestinal: soft, no guarding, focal TTP epigastric and RUQ.   Musculoskeletal: Steady movement  Skin: Cool and moist  Psychiatric: Normal affect, non-agitated, not confused       LABS:  CMP Latest Ref Rng & Units 08/24/2019 08/23/2019 08/22/2019  Glucose  70 - 99 mg/dL 85 - 127(H)  BUN 6 - 20 mg/dL 7 - 13  Creatinine 0.44 - 1.00 mg/dL 0.54 - 0.64  Sodium 135 - 145 mmol/L 140 - 139  Potassium 3.5 - 5.1 mmol/L 4.4 - 4.2  Chloride 98 - 111 mmol/L 101 - 103  CO2 22 - 32 mmol/L 29 - 28  Calcium 8.9 - 10.3  mg/dL 8.7(L) - 9.3  Total Protein 6.5 - 8.1 g/dL 6.9 8.1 7.8  Total Bilirubin 0.3 - 1.2 mg/dL 0.7 0.7 0.4  Alkaline Phos 38 - 126 U/L 115 90 81  AST 15 - 41 U/L 127(H) 30 26  ALT 0 - 44 U/L 148(H) 46(H) 45(H)   CBC Latest Ref Rng & Units 08/24/2019 08/22/2019 08/20/2019  WBC 4.0 - 10.5 K/uL 5.6 8.9 6.8  Hemoglobin 12.0 - 15.0 g/dL 11.6(L) 12.7 13.2  Hematocrit 36.0 - 46.0 % 37.8 40.0 41.3  Platelets 150 - 400 K/uL 236 270 331     RADS: CLINICAL DATA:  Right upper quadrant pain  EXAM: ULTRASOUND ABDOMEN LIMITED RIGHT UPPER QUADRANT  COMPARISON:  August 22, 2019  FINDINGS: Gallbladder:  Layering calcified gallstones are present the largest measuring 1.1 cm. There is a 9 mm shadowing calculus that is nonmobile seen at the gallbladder neck. There is diffuse gallbladder wall thickening measuring up to 7.5 mm.  Common bile duct:  Diameter: 5.1 mm  Liver:  No focal lesion identified. Within normal limits in parenchymal echogenicity. Portal vein is patent on color Doppler imaging with normal direction of blood flow towards the liver.  Other: None.  IMPRESSION: Findings suggestive of acute cholecystitis with a 9 mm calculus seen in the gallbladder neck.   Electronically Signed   By: Prudencio Pair M.D.   On: 08/23/2019 03:29  Assessment:      Acute cholecystitis PTSD Anxiety Seizure disorder HTN COPD  Plan:      Discussed the risk of surgery including post-op infxn, seroma, biloma, chronic pain, poor-delayed wound healing, retained gallstone, conversion to open procedure, post-op SBO or ileus, and need for additional procedures to address said risks.  The risks of general anesthetic including MI, CVA, sudden death or even reaction to anesthetic medications also discussed. Alternatives include continued observation.  Benefits include possible symptom relief, prevention of complications including acute cholecystitis, pancreatitis.  Typical post  operative recovery of 3-5 days rest, continued pain in area and incision sites, possible loose stools up to 4-6 weeks, also discussed.  The patient understands the risks, any and all questions were answered to the patient's satisfaction.  Will hold off surgery for few days due to daily aspirin use (3 baby aspirin/day).  Abx, IVF, CLD in the meantime.  Extensive psych history with associated anxiety, PTSD, seizure disorder, HTN ,COPD-  Will continue with home meds for now, social work consult as well.

## 2019-08-25 LAB — CBC
HCT: 37.9 % (ref 36.0–46.0)
Hemoglobin: 11.7 g/dL — ABNORMAL LOW (ref 12.0–15.0)
MCH: 28.7 pg (ref 26.0–34.0)
MCHC: 30.9 g/dL (ref 30.0–36.0)
MCV: 93.1 fL (ref 80.0–100.0)
Platelets: 224 10*3/uL (ref 150–400)
RBC: 4.07 MIL/uL (ref 3.87–5.11)
RDW: 13.2 % (ref 11.5–15.5)
WBC: 5.2 10*3/uL (ref 4.0–10.5)
nRBC: 0 % (ref 0.0–0.2)

## 2019-08-25 LAB — BASIC METABOLIC PANEL
Anion gap: 9 (ref 5–15)
BUN: 7 mg/dL (ref 6–20)
CO2: 30 mmol/L (ref 22–32)
Calcium: 8.8 mg/dL — ABNORMAL LOW (ref 8.9–10.3)
Chloride: 100 mmol/L (ref 98–111)
Creatinine, Ser: 0.66 mg/dL (ref 0.44–1.00)
GFR calc Af Amer: >60 mL/min (ref >60)
GFR calc non Af Amer: >60 mL/min (ref >60)
Glucose, Bld: 86 mg/dL (ref 70–99)
Potassium: 4.8 mmol/L (ref 3.5–5.1)
Sodium: 139 mmol/L (ref 135–145)

## 2019-08-25 LAB — HEPATIC FUNCTION PANEL
ALT: 159 U/L — ABNORMAL HIGH (ref 0–44)
AST: 96 U/L — ABNORMAL HIGH (ref 15–41)
Albumin: 3.5 g/dL (ref 3.5–5.0)
Alkaline Phosphatase: 144 U/L — ABNORMAL HIGH (ref 38–126)
Bilirubin, Direct: 0.1 mg/dL (ref 0.0–0.2)
Indirect Bilirubin: 0.4 mg/dL (ref 0.3–0.9)
Total Bilirubin: 0.5 mg/dL (ref 0.3–1.2)
Total Protein: 7.2 g/dL (ref 6.5–8.1)

## 2019-08-25 LAB — MAGNESIUM: Magnesium: 2.4 mg/dL (ref 1.7–2.4)

## 2019-08-25 LAB — PHOSPHORUS: Phosphorus: 3.2 mg/dL (ref 2.5–4.6)

## 2019-08-25 MED ORDER — CHLORHEXIDINE GLUCONATE CLOTH 2 % EX PADS
6.0000 | MEDICATED_PAD | Freq: Once | CUTANEOUS | Status: AC
  Administered 2019-08-25: 02:00:00 6 via TOPICAL

## 2019-08-25 MED ORDER — DIPHENHYDRAMINE HCL 25 MG PO CAPS
50.0000 mg | ORAL_CAPSULE | Freq: Four times a day (QID) | ORAL | Status: DC | PRN
  Administered 2019-08-26: 50 mg via ORAL
  Filled 2019-08-25: qty 2

## 2019-08-25 MED ORDER — HYDROMORPHONE HCL 1 MG/ML IJ SOLN
INTRAMUSCULAR | Status: AC
  Administered 2019-08-25: 0.5 mg
  Filled 2019-08-25: qty 1

## 2019-08-25 MED ORDER — HYDROMORPHONE HCL 1 MG/ML IJ SOLN
1.0000 mg | INTRAMUSCULAR | Status: DC | PRN
  Administered 2019-08-25 – 2019-08-28 (×16): 1 mg via INTRAVENOUS
  Filled 2019-08-25 (×16): qty 1

## 2019-08-25 NOTE — Progress Notes (Signed)
[facility] Hospital Day(s): 2.   Post op day(s):  Marland Kitchen   Interval History: Patient seen and examined, no acute events or new complaints overnight. Patient reports continued having pain on the right upper quadrant.  Denies nausea or vomiting.  Denies fever or chills.  Denies chest pain and shortness of breath.  The pain radiates to her right back.  Alleviating factor was fentanyl but the current pain medication has not improved her pain.  There is no aggravating factor.  Vital signs in last 24 hours: [min-max] current  Temp:  [97.9 F (36.6 C)-98.7 F (37.1 C)] 98.7 F (37.1 C) (10/24 0610) Pulse Rate:  [75-100] 92 (10/24 1026) Resp:  [17-20] 17 (10/24 0559) BP: (94-114)/(60-69) 114/68 (10/24 1026) SpO2:  [91 %-96 %] 93 % (10/24 0559)     Height: 5\' 3"  (160 cm) Weight: 75.3 kg BMI (Calculated): 29.41   Physical Exam:  Constitutional: alert, cooperative and no distress  Respiratory: breathing non-labored at rest  Cardiovascular: regular rate and sinus rhythm  Gastrointestinal: soft, tender in the right upper quadrant, and non-distended  Labs:  CBC Latest Ref Rng & Units 08/25/2019 08/24/2019 08/22/2019  WBC 4.0 - 10.5 K/uL 5.2 5.6 8.9  Hemoglobin 12.0 - 15.0 g/dL 11.7(L) 11.6(L) 12.7  Hematocrit 36.0 - 46.0 % 37.9 37.8 40.0  Platelets 150 - 400 K/uL 224 236 270   CMP Latest Ref Rng & Units 08/25/2019 08/24/2019 08/23/2019  Glucose 70 - 99 mg/dL 86 85 -  BUN 6 - 20 mg/dL 7 7 -  Creatinine 0.44 - 1.00 mg/dL 0.66 0.54 -  Sodium 135 - 145 mmol/L 139 140 -  Potassium 3.5 - 5.1 mmol/L 4.8 4.4 -  Chloride 98 - 111 mmol/L 100 101 -  CO2 22 - 32 mmol/L 30 29 -  Calcium 8.9 - 10.3 mg/dL 8.8(L) 8.7(L) -  Total Protein 6.5 - 8.1 g/dL 7.2 6.9 8.1  Total Bilirubin 0.3 - 1.2 mg/dL 0.5 0.7 0.7  Alkaline Phos 38 - 126 U/L 144(H) 115 90  AST 15 - 41 U/L 96(H) 127(H) 30  ALT 0 - 44 U/L 159(H) 148(H) 46(H)    Imaging studies: No new pertinent imaging  studies   Assessment/Plan:  58 y.o. female with likely cystitis, complicated by pertinent comorbidities including major depression with recent suicidal ideation (today with no suicidal ideas), PTSD, anxiety, seizure disorder, hypertension, COPD. Patient with persistent pain on the right upper quadrant.  IR consulted for percutaneous drainage due to patient high dose of aspirin 2 days ago.  Yesterday the procedure was not done because she had Lovenox and she ate breakfast.  Today patient without Lovenox and n.p.o.  Pending their disposition to date cholecystostomy tube.  We will continue optimizing pain management.  We will continue with IV antibiotic therapy.  As soon as procedure is done we will restart DVT prophylaxis.  Arnold Long, MD

## 2019-08-26 LAB — CBC
HCT: 36.8 % (ref 36.0–46.0)
Hemoglobin: 11.6 g/dL — ABNORMAL LOW (ref 12.0–15.0)
MCH: 29.4 pg (ref 26.0–34.0)
MCHC: 31.5 g/dL (ref 30.0–36.0)
MCV: 93.2 fL (ref 80.0–100.0)
Platelets: 185 10*3/uL (ref 150–400)
RBC: 3.95 MIL/uL (ref 3.87–5.11)
RDW: 12.8 % (ref 11.5–15.5)
WBC: 4.7 10*3/uL (ref 4.0–10.5)
nRBC: 0 % (ref 0.0–0.2)

## 2019-08-26 LAB — BASIC METABOLIC PANEL
Anion gap: 7 (ref 5–15)
BUN: 8 mg/dL (ref 6–20)
CO2: 31 mmol/L (ref 22–32)
Calcium: 8.6 mg/dL — ABNORMAL LOW (ref 8.9–10.3)
Chloride: 102 mmol/L (ref 98–111)
Creatinine, Ser: 0.59 mg/dL (ref 0.44–1.00)
GFR calc Af Amer: >60 mL/min (ref >60)
GFR calc non Af Amer: >60 mL/min (ref >60)
Glucose, Bld: 75 mg/dL (ref 70–99)
Potassium: 4.2 mmol/L (ref 3.5–5.1)
Sodium: 140 mmol/L (ref 135–145)

## 2019-08-26 LAB — HEPATIC FUNCTION PANEL
ALT: 116 U/L — ABNORMAL HIGH (ref 0–44)
AST: 52 U/L — ABNORMAL HIGH (ref 15–41)
Albumin: 3.4 g/dL — ABNORMAL LOW (ref 3.5–5.0)
Alkaline Phosphatase: 112 U/L (ref 38–126)
Bilirubin, Direct: <0.1 mg/dL (ref 0.0–0.2)
Total Bilirubin: 0.6 mg/dL (ref 0.3–1.2)
Total Protein: 6.8 g/dL (ref 6.5–8.1)

## 2019-08-26 LAB — MAGNESIUM: Magnesium: 2.5 mg/dL — ABNORMAL HIGH (ref 1.7–2.4)

## 2019-08-26 LAB — PHOSPHORUS: Phosphorus: 3.3 mg/dL (ref 2.5–4.6)

## 2019-08-26 NOTE — Progress Notes (Signed)
[facility] Hospital Day(s): 3.   Post op day(s):  Marland Kitchen   Interval History: Patient seen and examined, no acute events or new complaints overnight. Patient reports pain has been better controlled with current pain medication.  She does report but that when the medication fade away, the pain recur severely.  The pain on right upper quadrant.  There is no pain radiation.  There is no aggravating factor.  Aggravating factor is Dilaudid.  Denies fever or chills.   Vital signs in last 24 hours: [min-max] current  Temp:  [97.7 F (36.5 C)-98.4 F (36.9 C)] 98.4 F (36.9 C) (10/25 1142) Pulse Rate:  [81-92] 82 (10/25 1142) Resp:  [16-18] 16 (10/25 0518) BP: (106-120)/(63-72) 114/64 (10/25 1142) SpO2:  [96 %-99 %] 97 % (10/25 1142)     Height: 5\' 3"  (160 cm) Weight: 75.3 kg BMI (Calculated): 29.41   Physical Exam:  Constitutional: alert, cooperative and no distress  Respiratory: breathing non-labored at rest  Cardiovascular: regular rate and sinus rhythm  Gastrointestinal: soft, tender in right upper quadrant, and non-distended  Labs:  CBC Latest Ref Rng & Units 08/26/2019 08/25/2019 08/24/2019  WBC 4.0 - 10.5 K/uL 4.7 5.2 5.6  Hemoglobin 12.0 - 15.0 g/dL 11.6(L) 11.7(L) 11.6(L)  Hematocrit 36.0 - 46.0 % 36.8 37.9 37.8  Platelets 150 - 400 K/uL 185 224 236   CMP Latest Ref Rng & Units 08/26/2019 08/25/2019 08/24/2019  Glucose 70 - 99 mg/dL 75 86 85  BUN 6 - 20 mg/dL 8 7 7   Creatinine 0.44 - 1.00 mg/dL 0.59 0.66 0.54  Sodium 135 - 145 mmol/L 140 139 140  Potassium 3.5 - 5.1 mmol/L 4.2 4.8 4.4  Chloride 98 - 111 mmol/L 102 100 101  CO2 22 - 32 mmol/L 31 30 29   Calcium 8.9 - 10.3 mg/dL 8.6(L) 8.8(L) 8.7(L)  Total Protein 6.5 - 8.1 g/dL 6.8 7.2 6.9  Total Bilirubin 0.3 - 1.2 mg/dL 0.6 0.5 0.7  Alkaline Phos 38 - 126 U/L 112 144(H) 115  AST 15 - 41 U/L 52(H) 96(H) 127(H)  ALT 0 - 44 U/L 116(H) 159(H) 148(H)    Imaging studies: No new pertinent imaging  studies   Assessment/Plan:  59 y.o. female with likely cystitis, complicated by pertinent comorbidities including major depression with recent suicidal ideation (today with no suicidal ideas), PTSD, anxiety, seizure disorder, hypertension, COPD. Patient looks more comfortable today.  Patient with better pain control with current pain medication.  She does still have significant pain on the left side of the pain medication goes away.  IR did not evaluate the patient as they were going to during the weekend as per last radiologist note.  Will discuss with patient tomorrow the need of percutaneous drainage versus surgical management by admitting surgeon.  We will continue with IV antibiotics, current pain management, n.p.o.  Patient encouraged to ambulate and perform incentive spirometer.  Arnold Long, MD

## 2019-08-27 ENCOUNTER — Inpatient Hospital Stay: Payer: Self-pay

## 2019-08-27 LAB — BASIC METABOLIC PANEL
Anion gap: 11 (ref 5–15)
BUN: 10 mg/dL (ref 6–20)
CO2: 31 mmol/L (ref 22–32)
Calcium: 8.6 mg/dL — ABNORMAL LOW (ref 8.9–10.3)
Chloride: 99 mmol/L (ref 98–111)
Creatinine, Ser: 0.69 mg/dL (ref 0.44–1.00)
GFR calc Af Amer: >60 mL/min (ref >60)
GFR calc non Af Amer: >60 mL/min (ref >60)
Glucose, Bld: 55 mg/dL — ABNORMAL LOW (ref 70–99)
Potassium: 4.2 mmol/L (ref 3.5–5.1)
Sodium: 141 mmol/L (ref 135–145)

## 2019-08-27 LAB — CBC
HCT: 35.8 % — ABNORMAL LOW (ref 36.0–46.0)
Hemoglobin: 11 g/dL — ABNORMAL LOW (ref 12.0–15.0)
MCH: 28.7 pg (ref 26.0–34.0)
MCHC: 30.7 g/dL (ref 30.0–36.0)
MCV: 93.5 fL (ref 80.0–100.0)
Platelets: 205 10*3/uL (ref 150–400)
RBC: 3.83 MIL/uL — ABNORMAL LOW (ref 3.87–5.11)
RDW: 12.8 % (ref 11.5–15.5)
WBC: 4.9 10*3/uL (ref 4.0–10.5)
nRBC: 0 % (ref 0.0–0.2)

## 2019-08-27 LAB — PROTIME-INR
INR: 1.1 (ref 0.8–1.2)
Prothrombin Time: 13.7 seconds (ref 11.4–15.2)

## 2019-08-27 LAB — HEPATIC FUNCTION PANEL
ALT: 87 U/L — ABNORMAL HIGH (ref 0–44)
AST: 37 U/L (ref 15–41)
Albumin: 3.2 g/dL — ABNORMAL LOW (ref 3.5–5.0)
Alkaline Phosphatase: 128 U/L — ABNORMAL HIGH (ref 38–126)
Bilirubin, Direct: 0.1 mg/dL (ref 0.0–0.2)
Indirect Bilirubin: 0.6 mg/dL (ref 0.3–0.9)
Total Bilirubin: 0.7 mg/dL (ref 0.3–1.2)
Total Protein: 6.7 g/dL (ref 6.5–8.1)

## 2019-08-27 LAB — MAGNESIUM: Magnesium: 2.3 mg/dL (ref 1.7–2.4)

## 2019-08-27 LAB — PHOSPHORUS: Phosphorus: 3.1 mg/dL (ref 2.5–4.6)

## 2019-08-27 MED ORDER — MIDAZOLAM HCL 2 MG/2ML IJ SOLN
INTRAMUSCULAR | Status: AC | PRN
  Administered 2019-08-27 (×2): 1 mg via INTRAVENOUS

## 2019-08-27 MED ORDER — FENTANYL CITRATE (PF) 100 MCG/2ML IJ SOLN
INTRAMUSCULAR | Status: AC | PRN
  Administered 2019-08-27 (×2): 50 ug via INTRAVENOUS

## 2019-08-27 MED ORDER — MIDAZOLAM HCL 5 MG/5ML IJ SOLN
INTRAMUSCULAR | Status: AC
  Filled 2019-08-27: qty 5

## 2019-08-27 MED ORDER — FENTANYL CITRATE (PF) 100 MCG/2ML IJ SOLN
INTRAMUSCULAR | Status: AC
  Filled 2019-08-27: qty 4

## 2019-08-27 NOTE — Procedures (Signed)
Interventional Radiology Procedure Note  Procedure: Percutaneous Cholecystostomy  Complications: None  Estimated Blood Loss: < 10 mL  Findings: 10 Fr drain placed in GB lumen under CT guidance. Return of fairly clear bile and debris. Bile sample sent for culture. Drain attached to gravity bag.  Venetia Night. Kathlene Cote, M.D Pager:  [phone]

## 2019-08-27 NOTE — Progress Notes (Signed)
Patient clinically stable post Cholecystostomy tube placement per Dr Kathlene Cote. Tolerated well. Denies complaints post procedure. Vitals stable throughout procedure. Drain in place and draining dark/pink/brown material. Received Versed 2mg  IV along with Fentanyl 120mcg IV for procedure.report given to [facility] RN with queslions answered.

## 2019-08-27 NOTE — Progress Notes (Signed)
Subjective:  CC: Jessica Moran is a 58 y.o. female  Hospital stay day 4,   acute cholecystitis  HPI: No acute event overnight.  Pain still unbearable per patient when dilaudid wears off.    ROS:  General: Denies weight loss, weight gain, fatigue, fevers, chills, and night sweats. Heart: Denies chest pain, palpitations, racing heart, irregular heartbeat, leg pain or swelling, and decreased activity tolerance. Respiratory: Denies breathing difficulty, shortness of breath, wheezing, cough, and sputum. GI: Denies change in appetite, heartburn, nausea, vomiting, constipation, diarrhea, and blood in stool. GU: Denies difficulty urinating, pain with urinating, urgency, frequency, blood in urine.   Objective:   Temp:  [98.4 F (36.9 C)-98.6 F (37 C)] 98.6 F (37 C) (10/26 0414) Pulse Rate:  [82-92] 88 (10/26 0414) Resp:  [20] 20 (10/26 0414) BP: (101-120)/(64-73) 101/68 (10/26 0414) SpO2:  [97 %-99 %] 99 % (10/26 0414)     Height: 5\' 3"  (160 cm) Weight: 75.3 kg BMI (Calculated): 29.41   Intake/Output this shift:   Intake/Output Summary (Last 24 hours) at 08/27/2019 0719 Last data filed at 08/27/2019 0536 Gross per 24 hour  Intake 1178.33 ml  Output 800 ml  Net 378.33 ml    Constitutional :  alert, cooperative, appears stated age and no distress  Respiratory:  clear to auscultation bilaterally  Cardiovascular:  regular rate and rhythm  Gastrointestinal: soft, no guarding, but focal tendernss in epigastric/ruq region still present, no significant change from previous exam.   Skin: Cool and moist.   Psychiatric: Normal affect, non-agitated, not confused       LABS:  CMP Latest Ref Rng & Units 08/27/2019 08/26/2019 08/25/2019  Glucose 70 - 99 mg/dL 55(L) 75 86  BUN 6 - 20 mg/dL 10 8 7   Creatinine 0.44 - 1.00 mg/dL 0.69 0.59 0.66  Sodium 135 - 145 mmol/L 141 140 139  Potassium 3.5 - 5.1 mmol/L 4.2 4.2 4.8  Chloride 98 - 111 mmol/L 99 102 100  CO2 22 - 32 mmol/L 31 31 30    Calcium 8.9 - 10.3 mg/dL 8.6(L) 8.6(L) 8.8(L)  Total Protein 6.5 - 8.1 g/dL 6.7 6.8 7.2  Total Bilirubin 0.3 - 1.2 mg/dL 0.7 0.6 0.5  Alkaline Phos 38 - 126 U/L 128(H) 112 144(H)  AST 15 - 41 U/L 37 52(H) 96(H)  ALT 0 - 44 U/L 87(H) 116(H) 159(H)   CBC Latest Ref Rng & Units 08/27/2019 08/26/2019 08/25/2019  WBC 4.0 - 10.5 K/uL 4.9 4.7 5.2  Hemoglobin 12.0 - 15.0 g/dL 11.0(L) 11.6(L) 11.7(L)  Hematocrit 36.0 - 46.0 % 35.8(L) 36.8 37.9  Platelets 150 - 400 K/uL 205 185 224    RADS: n/a Assessment:   Acute cholecystitis Reported pain is still severe when dialudid wears off, despite continued abx treatement.    At this point in her hospital stay, the effects of her daily aspirin use should be minimal risk factor to proceed with surgery.  I did explain to her that she is still at a high risk of complications from the surgery itself due to the time has taken for the aspirin to wear off.  Discussed the risk benefits alternatives as noted below again.  After discussion of the option of surgery which is now my initial recommendation, patient has requested that we proceed with a percutaneous drainage instead to minimize perioperative complications.  She specifically verbalized understanding of how this percutaneous drain will stay in place for weeks until the initial inflammation calms down, and that this is only a  temporary measure where she will eventually require a interval cholecystectomy.  Discussed the risk of surgery including post-op infxn, seroma, biloma, chronic pain, poor-delayed wound healing, retained gallstone, conversion to open procedure, post-op SBO or ileus, and need for additional procedures to address said risks.  The risks of general anesthetic including MI, CVA, sudden death or even reaction to anesthetic medications also discussed. Alternatives include continued observation.  Benefits include possible symptom relief, prevention of complications including acute cholecystitis,  pancreatitis.  Typical post operative recovery of 3-5 days rest, continued pain in area and incision sites, possible loose stools up to 4-6 weeks, also discussed.   Patient again verbalized understanding and is still requesting a percutaneous drain.  Will discuss with radiology colleagues about the situation and requests that the drain be placed.    PTSD Anxiety Seizure disorder HTN COPD

## 2019-08-28 LAB — HEPATIC FUNCTION PANEL
ALT: 72 U/L — ABNORMAL HIGH (ref 0–44)
AST: 27 U/L (ref 15–41)
Albumin: 3.5 g/dL (ref 3.5–5.0)
Alkaline Phosphatase: 114 U/L (ref 38–126)
Bilirubin, Direct: <0.1 mg/dL (ref 0.0–0.2)
Total Bilirubin: 0.4 mg/dL (ref 0.3–1.2)
Total Protein: 7 g/dL (ref 6.5–8.1)

## 2019-08-28 LAB — MAGNESIUM: Magnesium: 2.4 mg/dL (ref 1.7–2.4)

## 2019-08-28 LAB — BASIC METABOLIC PANEL
Anion gap: 7 (ref 5–15)
BUN: 6 mg/dL (ref 6–20)
CO2: 36 mmol/L — ABNORMAL HIGH (ref 22–32)
Calcium: 9 mg/dL (ref 8.9–10.3)
Chloride: 98 mmol/L (ref 98–111)
Creatinine, Ser: 0.65 mg/dL (ref 0.44–1.00)
GFR calc Af Amer: >60 mL/min (ref >60)
GFR calc non Af Amer: >60 mL/min (ref >60)
Glucose, Bld: 116 mg/dL — ABNORMAL HIGH (ref 70–99)
Potassium: 4.3 mmol/L (ref 3.5–5.1)
Sodium: 141 mmol/L (ref 135–145)

## 2019-08-28 LAB — CBC
HCT: 37.9 % (ref 36.0–46.0)
Hemoglobin: 11.8 g/dL — ABNORMAL LOW (ref 12.0–15.0)
MCH: 28.6 pg (ref 26.0–34.0)
MCHC: 31.1 g/dL (ref 30.0–36.0)
MCV: 92 fL (ref 80.0–100.0)
Platelets: 203 10*3/uL (ref 150–400)
RBC: 4.12 MIL/uL (ref 3.87–5.11)
RDW: 12.6 % (ref 11.5–15.5)
WBC: 6.2 10*3/uL (ref 4.0–10.5)
nRBC: 0 % (ref 0.0–0.2)

## 2019-08-28 LAB — PHOSPHORUS: Phosphorus: 2.6 mg/dL (ref 2.5–4.6)

## 2019-08-28 MED ORDER — SODIUM CHLORIDE FLUSH 0.9 % IV SOLN: INTRAVENOUS | 0 refills | Status: DC

## 2019-08-28 MED ORDER — SODIUM CHLORIDE 0.9% FLUSH
5.0000 mL | Freq: Two times a day (BID) | INTRAVENOUS | Status: DC
  Administered 2019-08-28 – 2019-08-29 (×3): 5 mL

## 2019-08-28 MED ORDER — CALCIUM CARBONATE ANTACID 500 MG PO CHEW
1.0000 | CHEWABLE_TABLET | Freq: Three times a day (TID) | ORAL | Status: DC | PRN
  Administered 2019-08-28 (×2): 200 mg via ORAL
  Filled 2019-08-28 (×2): qty 1

## 2019-08-28 MED ORDER — METOCLOPRAMIDE HCL 5 MG/ML IJ SOLN
5.0000 mg | Freq: Three times a day (TID) | INTRAMUSCULAR | Status: DC | PRN
  Administered 2019-08-28 – 2019-08-29 (×3): 5 mg via INTRAVENOUS
  Filled 2019-08-28 (×3): qty 2

## 2019-08-28 NOTE — Plan of Care (Signed)
  Problem: Nutrition: Goal: Adequate nutrition will be maintained Outcome: Progressing  Patient with poor appetite. Nausea medication given.

## 2019-08-28 NOTE — Progress Notes (Signed)
Subjective:  CC: Jessica Moran is a 58 y.o. female  Hospital stay day 5,   acute cholecystitis  HPI: S/p cholecystostomy tube yesterday.  Pain slightly better.   ROS:  General: Denies weight loss, weight gain, fatigue, fevers, chills, and night sweats. Heart: Denies chest pain, palpitations, racing heart, irregular heartbeat, leg pain or swelling, and decreased activity tolerance. Respiratory: Denies breathing difficulty, shortness of breath, wheezing, cough, and sputum. GI: Denies change in appetite, heartburn, nausea, vomiting, constipation, diarrhea, and blood in stool. GU: Denies difficulty urinating, pain with urinating, urgency, frequency, blood in urine.   Objective:   Temp:  [97.7 F (36.5 C)-98.4 F (36.9 C)] 97.7 F (36.5 C) (10/27 0450) Pulse Rate:  [78-89] 88 (10/27 0450) Resp:  [8-18] 18 (10/27 0450) BP: (94-137)/(60-88) 97/60 (10/27 0450) SpO2:  [92 %-100 %] 92 % (10/27 0450)     Height: 5\' 3"  (160 cm) Weight: 75.3 kg BMI (Calculated): 29.41   Intake/Output this shift:   Intake/Output Summary (Last 24 hours) at 08/28/2019 1200 Last data filed at 08/28/2019 0240 Gross per 24 hour  Intake 1018.32 ml  Output 1470 ml  Net -451.68 ml    Constitutional :  alert, cooperative, appears stated age and no distress  Respiratory:  clear to auscultation bilaterally  Cardiovascular:  regular rate and rhythm  Gastrointestinal: soft, no guarding, but focal tendernss in epigastric/ruq region still present, no significant change from previous exam. Tube draining dark bilious drainage.  Skin: Cool and moist.   Psychiatric: Normal affect, non-agitated, not confused       LABS:  CMP Latest Ref Rng & Units 08/28/2019 08/27/2019 08/26/2019  Glucose 70 - 99 mg/dL 116(H) 55(L) 75  BUN 6 - 20 mg/dL 6 10 8   Creatinine 0.44 - 1.00 mg/dL 0.65 0.69 0.59  Sodium 135 - 145 mmol/L 141 141 140  Potassium 3.5 - 5.1 mmol/L 4.3 4.2 4.2  Chloride 98 - 111 mmol/L 98 99 102  CO2 22 -  32 mmol/L 36(H) 31 31  Calcium 8.9 - 10.3 mg/dL 9.0 8.6(L) 8.6(L)  Total Protein 6.5 - 8.1 g/dL 7.0 6.7 6.8  Total Bilirubin 0.3 - 1.2 mg/dL 0.4 0.7 0.6  Alkaline Phos 38 - 126 U/L 114 128(H) 112  AST 15 - 41 U/L 27 37 52(H)  ALT 0 - 44 U/L 72(H) 87(H) 116(H)   CBC Latest Ref Rng & Units 08/28/2019 08/27/2019 08/26/2019  WBC 4.0 - 10.5 K/uL 6.2 4.9 4.7  Hemoglobin 12.0 - 15.0 g/dL 11.8(L) 11.0(L) 11.6(L)  Hematocrit 36.0 - 46.0 % 37.9 35.8(L) 36.8  Platelets 150 - 400 K/uL 203 205 185    RADS: n/a Assessment:   Acute cholecystitis S/p perc chole tube.  Pain better. Tolerated clears.  Will advance as tolerated and switch to oral meds only.  IF tolerates, potentially d/c today.   PTSD Anxiety Seizure disorder HTN COPD Continue home meds for above chronic issues

## 2019-08-29 LAB — HEPATIC FUNCTION PANEL
ALT: 61 U/L — ABNORMAL HIGH (ref 0–44)
AST: 30 U/L (ref 15–41)
Albumin: 3.6 g/dL (ref 3.5–5.0)
Alkaline Phosphatase: 113 U/L (ref 38–126)
Bilirubin, Direct: <0.1 mg/dL (ref 0.0–0.2)
Total Bilirubin: 0.5 mg/dL (ref 0.3–1.2)
Total Protein: 7.3 g/dL (ref 6.5–8.1)

## 2019-08-29 LAB — MAGNESIUM: Magnesium: 2.4 mg/dL (ref 1.7–2.4)

## 2019-08-29 LAB — PHOSPHORUS: Phosphorus: 3.3 mg/dL (ref 2.5–4.6)

## 2019-08-29 MED ORDER — ONDANSETRON 4 MG PO TBDP: 4.0000 mg | ORAL_TABLET | Freq: Three times a day (TID) | ORAL | 0 refills | Status: DC | PRN

## 2019-08-29 MED ORDER — AMOXICILLIN-POT CLAVULANATE 875-125 MG PO TABS: 1.0000 | ORAL_TABLET | Freq: Two times a day (BID) | ORAL | 0 refills | Status: AC

## 2019-08-29 NOTE — Progress Notes (Signed)
Jessica Moran A and O x4. VSS. Pt tolerating diet well. No complaints of nausea or vomiting. IV removed intact, prescriptions given. Pt voices understanding of discharge instructions with no further questions. Patient discharged via wheelchair with SN.  Allergies as of 08/29/2019      Reactions   Ibuprofen Other (See Comments)   Reaction: unknown      Medication List    STOP taking these medications   aspirin 81 MG tablet     TAKE these medications   amoxicillin-clavulanate 875-125 MG tablet Commonly known as: Augmentin Take 1 tablet by mouth 2 (two) times daily for 6 days.   clonazePAM 1 MG tablet Commonly known as: KLONOPIN Take 1 mg by mouth 2 (two) times daily.   lamoTRIgine 100 MG tablet Commonly known as: LAMICTAL Take 100 mg by mouth 2 (two) times daily.   levETIRAcetam 500 MG tablet Commonly known as: KEPPRA Take 500 mg by mouth 2 (two) times daily.   ondansetron 4 MG disintegrating tablet Commonly known as: Zofran ODT Take 1 tablet (4 mg total) by mouth every 8 (eight) hours as needed for nausea or vomiting.   pregabalin 100 MG capsule Commonly known as: LYRICA Take 100 mg by mouth 2 (two) times daily.   QUEtiapine 200 MG tablet Commonly known as: SEROQUEL Take 200 mg by mouth daily.   QUEtiapine 400 MG tablet Commonly known as: SEROQUEL Take 400 mg by mouth at bedtime.   sodium chloride flush 0.9 % Soln injection Flush Catheter as instructed       Vitals:   08/29/19 0931 08/29/19 1216  BP: 105/63 96/75  Pulse: 97 63  Resp: 13 14  Temp: 98.3 F (36.8 C) 98.4 F (36.9 C)  SpO2: 100% 98%    Jessica Moran

## 2019-08-30 NOTE — Discharge Summary (Addendum)
Physician Discharge Summary  Patient ID: Jessica Moran MRN: [id number] DOB/AGE: [id number] y.o.  Admit date: 08/22/2019 Discharge date: 08/29/2019  Admission Diagnoses: Acute cholecystitis  Discharge Diagnoses:  Same as above  Discharged Condition: good  Hospital Course: Admitted for above.  Due to her high dose aspirin use prior to admission, started on antibiotics initially to see if the acute cholecystitis resolved.  Pain did not improve despite antibiotic use, so discussion of percutaneous drainage versus proceeding with laparoscopic cholecystectomy.  Patient eventually decided upon a percutaneous cholecystostomy tube placement which was performed by interventional radiology.  Afterwards patient slowly recovered and the pain was tolerable enough for patient to be discharged with oral antibiotics to complete her course.  At the time of discharge patient was tolerating a diet and pain was controlled with oral meds.  Plan is to proceed with a interval cholecystectomy in the next several weeks.  Consults: IR  Discharge Exam: Blood pressure 96/75, pulse 63, temperature 98.4 F (36.9 C), temperature source Oral, resp. rate 14, height 5\' 3"  (1.6 m), weight 75.3 kg, SpO2 98 %. General appearance: alert, cooperative and no distress GI: Soft, no guarding, minimal tenderness around drain site, which is draining dark bilious fluid.  Disposition:     Allergies as of 08/29/2019      Reactions   Ibuprofen Other (See Comments)   Reaction: unknown      Medication List    STOP taking these medications   aspirin 81 MG tablet     TAKE these medications   amoxicillin-clavulanate 875-125 MG tablet Commonly known as: Augmentin Take 1 tablet by mouth 2 (two) times daily for 6 days.   clonazePAM 1 MG tablet Commonly known as: KLONOPIN Take 1 mg by mouth 2 (two) times daily.   lamoTRIgine 100 MG tablet Commonly known as: LAMICTAL Take 100 mg by mouth 2 (two) times daily.    levETIRAcetam 500 MG tablet Commonly known as: KEPPRA Take 500 mg by mouth 2 (two) times daily.   ondansetron 4 MG disintegrating tablet Commonly known as: Zofran ODT Take 1 tablet (4 mg total) by mouth every 8 (eight) hours as needed for nausea or vomiting.   pregabalin 100 MG capsule Commonly known as: LYRICA Take 100 mg by mouth 2 (two) times daily.   QUEtiapine 200 MG tablet Commonly known as: SEROQUEL Take 200 mg by mouth daily.   QUEtiapine 400 MG tablet Commonly known as: SEROQUEL Take 400 mg by mouth at bedtime.   sodium chloride flush 0.9 % Soln injection Flush Catheter as instructed      Follow-up Information    Jessica Wrenn, DO. Go on 09/12/2019.   Specialty: Surgery Why: 2pm appointment Contact information: [place] [address] [phone]            Total time spent arranging discharge was >31min. Signed: Benjamine Sprague 08/30/2019, 11:17 AM

## 2019-08-30 NOTE — TOC Initial Note (Signed)
Transition of Care [facility]) - Initial/Assessment Note    Patient Details  Name: Jessica Moran MRN: [id number] Date of Birth: [birth year]  Transition of Care [facility]) CM/SW Contact:    Chapman Fitch, RN Phone Number: 08/30/2019, 5:04 PM  Clinical Narrative:                 Late entry:  Patient discharged on 08/29/19 Patient recently moved 3 weeks ago from [place] to live with her daughter in [place].  Patient lives at home with daughter, son in law, and grand children.   Patients does not have insurance.  She was paying out of pocket for her PCP and psychologist in [place].  Patient would like a new local PCP.  Patient states that she is pulgged in with [facility] here, and goes 2 twice a week. They supply her psych meds.    Patient lives off of a trust fund.  They have secured and 1st floor apartment for her which she will move in to on Nov 10th.    Patient is independent . No assistive devices.  Pays out of pocket for home o2 through [employer].  States she has a Insurance underwriter, and portable tanks.    Patient states that she feels safe where she lives. Expresses no areas of concerns.   Daughter request information on free and sliding scale clinics, as well as additional oxygen companies.  These resources were emailed to [email or web address]  Expected Discharge Plan: Home/Self Care Barriers to Discharge: Barriers Resolved   Patient Goals and CMS Choice        Expected Discharge Plan and Services Expected Discharge Plan: Home/Self Care   Discharge Planning Services: CM Consult   Living arrangements for the past 2 months: Single Family Home Expected Discharge Date: 08/29/19                                    Prior Living Arrangements/Services Living arrangements for the past 2 months: Single Family Home Lives with:: Adult Children, Relatives Patient language and need for interpreter reviewed:: Yes Do you feel safe going back to the place where you live?: Yes       Need for Family Participation in Patient Care: Yes (Comment) Care giver support system in place?: Yes (comment) Current home services: DME Criminal Activity/Legal Involvement Pertinent to Current Situation/Hospitalization: No - Comment as needed  Activities of Daily Living Home Assistive Devices/Equipment: Environmental consultant (specify type)(Rollator) ADL Screening (condition at time of admission) Patient's cognitive ability adequate to safely complete daily activities?: Yes Is the patient deaf or have difficulty hearing?: No Does the patient have difficulty seeing, even when wearing glasses/contacts?: No Does the patient have difficulty concentrating, remembering, or making decisions?: Yes(intermittent) Patient able to express need for assistance with ADLs?: Yes Does the patient have difficulty dressing or bathing?: No Independently performs ADLs?: Yes (appropriate for developmental age) Does the patient have difficulty walking or climbing stairs?: Yes Weakness of Legs: Right Weakness of Arms/Hands: Right  Permission Sought/Granted                  Emotional Assessment Appearance:: Appears stated age     Orientation: : Oriented to Self, Oriented to Place, Oriented to  Time, Oriented to Situation   Psych Involvement: No (comment)  Admission diagnosis:  Acute cholecystitis [K81.0] RUQ pain [R10.11] Patient Active Problem List   Diagnosis Date Noted  . Acute cholecystitis 08/23/2019  . Adjustment  disorder with mixed anxiety and depressed mood   . Hypertension 11/11/2012  . Lacunar infarct, acute ([place]) 11/11/2012  . Seizure, epileptic ([place]) 11/09/2012  . Osteoarthritis of hip 07/19/2012  . 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 Menopause   . Tobacco abuse   . Post traumatic stress disorder (PTSD) 09/07/2011  . Tobacco abuse counseling    PCP:  Micheline Rough, MD Pharmacy:   [place], [place] [address] Phone:  [phone] Fax: [place], [place] [address] [place] [address] Phone: [phone] Fax: [phone]     Social Determinants of Health (SDOH) Interventions    Readmission Risk Interventions No flowsheet data found.

## 2019-09-01 LAB — AEROBIC/ANAEROBIC CULTURE W GRAM STAIN (SURGICAL/DEEP WOUND)
Culture: NO GROWTH
Special Requests: NORMAL

## 2019-09-03 ENCOUNTER — Emergency Department
Admission: EM | Admit: 2019-09-03 | Discharge: 2019-09-04 | Disposition: A | Discharge status: Home / Self Care | Payer: Self-pay | Attending: Emergency Medicine | Admitting: Emergency Medicine

## 2019-09-03 ENCOUNTER — Other Ambulatory Visit: Payer: Self-pay

## 2019-09-03 ENCOUNTER — Encounter: Payer: Self-pay | Admitting: Emergency Medicine

## 2019-09-03 DIAGNOSIS — F329 Major depressive disorder, single episode, unspecified: Secondary | ICD-10-CM

## 2019-09-03 DIAGNOSIS — Z79899 Other long term (current) drug therapy: Secondary | ICD-10-CM | POA: Insufficient documentation

## 2019-09-03 DIAGNOSIS — I10 Essential (primary) hypertension: Secondary | ICD-10-CM | POA: Insufficient documentation

## 2019-09-03 DIAGNOSIS — F603 Borderline personality disorder: Secondary | ICD-10-CM | POA: Insufficient documentation

## 2019-09-03 DIAGNOSIS — Z886 Allergy status to analgesic agent status: Secondary | ICD-10-CM | POA: Insufficient documentation

## 2019-09-03 DIAGNOSIS — F319 Bipolar disorder, unspecified: Secondary | ICD-10-CM | POA: Insufficient documentation

## 2019-09-03 DIAGNOSIS — F32A Depression, unspecified: Secondary | ICD-10-CM

## 2019-09-03 DIAGNOSIS — I252 Old myocardial infarction: Secondary | ICD-10-CM | POA: Insufficient documentation

## 2019-09-03 DIAGNOSIS — Z87891 Personal history of nicotine dependence: Secondary | ICD-10-CM | POA: Insufficient documentation

## 2019-09-03 DIAGNOSIS — Z20828 Contact with and (suspected) exposure to other viral communicable diseases: Secondary | ICD-10-CM | POA: Insufficient documentation

## 2019-09-03 DIAGNOSIS — J449 Chronic obstructive pulmonary disease, unspecified: Secondary | ICD-10-CM | POA: Insufficient documentation

## 2019-09-03 LAB — URINE DRUG SCREEN, QUALITATIVE (ARMC ONLY)
Amphetamines, Ur Screen: NOT DETECTED
Barbiturates, Ur Screen: NOT DETECTED
Benzodiazepine, Ur Scrn: POSITIVE — AB
Cannabinoid 50 Ng, Ur ~~LOC~~: NOT DETECTED
Cocaine Metabolite,Ur ~~LOC~~: NOT DETECTED
MDMA (Ecstasy)Ur Screen: NOT DETECTED
Methadone Scn, Ur: NOT DETECTED
Opiate, Ur Screen: NOT DETECTED
Phencyclidine (PCP) Ur S: NOT DETECTED
Tricyclic, Ur Screen: POSITIVE — AB

## 2019-09-03 LAB — COMPREHENSIVE METABOLIC PANEL
ALT: 132 U/L — ABNORMAL HIGH (ref 0–44)
AST: 98 U/L — ABNORMAL HIGH (ref 15–41)
Albumin: 4.4 g/dL (ref 3.5–5.0)
Alkaline Phosphatase: 123 U/L (ref 38–126)
Anion gap: 13 (ref 5–15)
BUN: 6 mg/dL (ref 6–20)
CO2: 31 mmol/L (ref 22–32)
Calcium: 9.5 mg/dL (ref 8.9–10.3)
Chloride: 96 mmol/L — ABNORMAL LOW (ref 98–111)
Creatinine, Ser: 0.79 mg/dL (ref 0.44–1.00)
GFR calc Af Amer: >60 mL/min (ref >60)
GFR calc non Af Amer: >60 mL/min (ref >60)
Glucose, Bld: 96 mg/dL (ref 70–99)
Potassium: 3.2 mmol/L — ABNORMAL LOW (ref 3.5–5.1)
Sodium: 140 mmol/L (ref 135–145)
Total Bilirubin: 0.7 mg/dL (ref 0.3–1.2)
Total Protein: 8.3 g/dL — ABNORMAL HIGH (ref 6.5–8.1)

## 2019-09-03 LAB — CBC
HCT: 43 % (ref 36.0–46.0)
Hemoglobin: 13.4 g/dL (ref 12.0–15.0)
MCH: 28.6 pg (ref 26.0–34.0)
MCHC: 31.2 g/dL (ref 30.0–36.0)
MCV: 91.9 fL (ref 80.0–100.0)
Platelets: 320 10*3/uL (ref 150–400)
RBC: 4.68 MIL/uL (ref 3.87–5.11)
RDW: 13.7 % (ref 11.5–15.5)
WBC: 7.9 10*3/uL (ref 4.0–10.5)
nRBC: 0 % (ref 0.0–0.2)

## 2019-09-03 LAB — ACETAMINOPHEN LEVEL: Acetaminophen (Tylenol), Serum: <10 ug/mL — ABNORMAL LOW (ref 10–30)

## 2019-09-03 LAB — SALICYLATE LEVEL: Salicylate Lvl: <7 mg/dL (ref 2.8–30.0)

## 2019-09-03 LAB — ETHANOL: Alcohol, Ethyl (B): <10 mg/dL (ref <10)

## 2019-09-03 LAB — SARS CORONAVIRUS 2 BY RT PCR (HOSPITAL ORDER, PERFORMED IN ~~LOC~~ HOSPITAL LAB): SARS Coronavirus 2: NEGATIVE

## 2019-09-03 MED ORDER — QUETIAPINE FUMARATE 200 MG PO TABS
400.0000 mg | ORAL_TABLET | Freq: Every day | ORAL | Status: DC
  Administered 2019-09-03: 19:00:00 400 mg via ORAL
  Filled 2019-09-03: qty 2

## 2019-09-03 MED ORDER — PREGABALIN 50 MG PO CAPS
100.0000 mg | ORAL_CAPSULE | Freq: Two times a day (BID) | ORAL | Status: DC
  Administered 2019-09-03 – 2019-09-04 (×2): 100 mg via ORAL
  Filled 2019-09-03 (×2): qty 2

## 2019-09-03 NOTE — ED Notes (Signed)
Pt c/o constipation, dr monks notified.

## 2019-09-03 NOTE — ED Notes (Signed)
IVC prior to arrival/ Consult ordered/completed/pendingDisposition

## 2019-09-03 NOTE — ED Triage Notes (Signed)
Pt to ED via with [facility] from [place] under IVC. Pt states that she went to [facility] to sign up for counseling. Pt states that she has been under a lot stress lately. She lost her home, husband, car, job, and her license. Pt has hx/o bipolar and substance abuse. Pt is calm and cooperative in triage at this time.

## 2019-09-03 NOTE — ED Notes (Signed)
Psych consult at bedside.

## 2019-09-03 NOTE — ED Notes (Addendum)
Resumed care from [facility].  Pt on 3 liters oxygen. Pt is IVC from [place].  Pt was moved from [facility] bed.

## 2019-09-03 NOTE — ED Notes (Signed)
Pt sleeping. 

## 2019-09-03 NOTE — ED Notes (Signed)
Pt sleeping  siderails  X 2.

## 2019-09-03 NOTE — Consult Note (Signed)
[facility]   Reason for Consult: IVC by outpatient Referring Physician: Dr. Derrill KayGoodman Patient Identification: Jessica Moran MRN:  [id number] Principal Diagnosis: <principal problem not specified> Diagnosis:  Active Problems:   * No active hospital problems. *   Total Time spent with patient: 45 minutes  Subjective:   Jessica Moran is a 58 y.o. female patient who presented on IVC from outpatient office.Marland Kitchen.  HPI: Patient is a 58 year old female with past psychiatric history of bipolar disorder and borderline personality disorder who presents on IVC from outpatient office.  Per the patient she went to her outpatient psychiatric appointment today to establish care.  During which she showed them the scars on her arms and spoke about her life and was sent here on IVC due to their feeling that she was too acute to be managed on outpatient basis.  Despite this patient denies any suicidal or homicidal complaints.  States that she has been feeling depressed in the context of her social stressors and feels that her social stress has worsened since having to spend approximately a month in the hospital due to her gallbladder.  Patient states that despite all this she was feeling better and was surprised that [facility] had committed her to the hospital.  She did endorse that she had spoken to the crisis line but states that it was due to them checking up on her since she had called them during a stressful moment the days prior.   Patient states that she has not taken her Seroquel medication for several days and feels that it might have contributed to her presentation.  She denies any audio or visual hallucinations.  She denies any manic symptoms.  She endorses sleeping well despite missing her medication.  She does endorse depression but denies suicidal ideation and lists reasons for living.  Patient has multiple superficial scratches on her arms.  Denies any of the scratches were made  as an attempt to kill herself.  She next acknowledges her nonsuicidal self-injurious behavior.  Collateral information was obtained by Dr. Morton AmyLutz of [facility].  Dr. Morton AmyLutz said that the patient presented and was talking about how life is really bad.  They were concerned for suicidal ideation and decided to send the patient into the emergency room.  Collateral information was also obtained from the patient's daughter Jessica Moran.  Jessica Moran feels that her mother's problem is primarily with drug-seeking behavior.  She states that despite being put on a new regimen patient continued to take medication from her old regimen and was found stashing those medications in the house.  She denies that her mother is a suicidal threat at the moment she feels that her mother would not do anything to hurt herself aside from the self-injurious scratches on her arms.  Lateral information was also obtained from the patient's son Jessica Moran.  [name] was the one who drove the patient to the appointment felt that the patient was in her usual state of health, however he was looking forward to his mother finally receiving some psychiatric care.  He was disappointed to understand that the sent the patient back to the emergency department, and feels that the solution has to be come to regarding his mother receiving care on the right basis.  Past Psychiatric History: Patient has a history of bipolar disorder and personality disorder. Patient has a history of 1 prior psychiatric mission approximately 16 years ago for suicidal ideation.    Risk to Self:  No Risk to Others:  No Prior Inpatient Therapy:  Yes Prior Outpatient Therapy:  Yes  Past Medical History:  Past Medical History:  Diagnosis Date  . Anxiety   . COPD (chronic obstructive pulmonary disease) (HCC)   . Hypertension   . Menopause   . MI (myocardial infarction) (HCC)   . PTSD (post-traumatic stress disorder)   . Seizures (HCC)   . Stroke (HCC)   . Tobacco abuse   . Tobacco  abuse counseling     Past Surgical History:  Procedure Laterality Date  . LAPAROSCOPIC SUPRACERVICAL HYSTERECTOMY  1998   [place]  . SPINE SURGERY     Family History:  Family History  Problem Relation Age of Onset  . Stroke Neg Hx   . Hearing loss Neg Hx    Family Psychiatric  History: Patient denies Social History:  Social History   Substance and Sexual Activity  Alcohol Use Yes     Social History   Substance and Sexual Activity  Drug Use Yes    Social History   Socioeconomic History  . Marital status: Legally Separated    Spouse name: Not on file  . Number of children: Not on file  . Years of education: Not on file  . Highest education level: Not on file  Occupational History  . Not on file  Social Needs  . Financial resource strain: Not on file  . Food insecurity    Worry: Not on file    Inability: Not on file  . Transportation needs    Medical: Not on file    Non-medical: Not on file  Tobacco Use  . Smoking status: Former Smoker    Packs/day: 0.50    Types: Cigarettes    Quit date: 11/22/2018    Years since quitting: 0.7  . Smokeless tobacco: Never Used  Substance and Sexual Activity  . Alcohol use: Yes  . Drug use: Yes  . Sexual activity: Not on file  Lifestyle  . Physical activity    Days per week: Not on file    Minutes per session: Not on file  . Stress: Not on file  Relationships  . Social Musician on phone: Not on file    Gets together: Not on file    Attends religious service: Not on file    Active member of club or organization: Not on file    Attends meetings of clubs or organizations: Not on file    Relationship status: Not on file  Other Topics Concern  . Not on file  Social History Narrative  . Not on file   Additional Social History: Patient lives with her daughter and her 7 grandchildren.  Not currently working.  Denies substance use.    Allergies:   Allergies  Allergen Reactions  . Ibuprofen Other (See  Comments)    Unknown     Labs:  Results for orders placed or performed during the hospital encounter of 09/03/19 (from the past 48 hour(s))  Comprehensive metabolic panel     Status: Abnormal   Collection Time: 09/03/19  1:47 PM  Result Value Ref Range   Sodium 140 135 - 145 mmol/L   Potassium 3.2 (L) 3.5 - 5.1 mmol/L   Chloride 96 (L) 98 - 111 mmol/L   CO2 31 22 - 32 mmol/L   Glucose, Bld 96 70 - 99 mg/dL   BUN 6 6 - 20 mg/dL   Creatinine, Ser 1.61 0.44 - 1.00 mg/dL   Calcium 9.5 8.9 - 10.3  mg/dL   Total Protein 8.3 (H) 6.5 - 8.1 g/dL   Albumin 4.4 3.5 - 5.0 g/dL   AST 98 (H) 15 - 41 U/L   ALT 132 (H) 0 - 44 U/L   Alkaline Phosphatase 123 38 - 126 U/L   Total Bilirubin 0.7 0.3 - 1.2 mg/dL   GFR calc non Af Amer >60 >60 mL/min   GFR calc Af Amer >60 >60 mL/min   Anion gap 13 5 - 15    Comment: Performed at [facility], [address] [place], [place] [address]  Ethanol     Status: None   Collection Time: 09/03/19  1:47 PM  Result Value Ref Range   Alcohol, Ethyl (B) <10 <10 mg/dL    Comment: (NOTE) Lowest detectable limit for serum alcohol is 10 mg/dL. For medical purposes only. Performed at [facility], [address] [place], [place] [address]   Salicylate level     Status: None   Collection Time: 09/03/19  1:47 PM  Result Value Ref Range   Salicylate Lvl <7.0 2.8 - 30.0 mg/dL    Comment: Performed at [facility], [address] [place], [place] [address]  Acetaminophen level     Status: Abnormal   Collection Time: 09/03/19  1:47 PM  Result Value Ref Range   Acetaminophen (Tylenol), Serum <10 (L) 10 - 30 ug/mL    Comment: (NOTE) Therapeutic concentrations vary significantly. A range of 10-30 ug/mL  may be an effective concentration for many patients. However, some  are best treated at concentrations outside of this range. Acetaminophen concentrations >150 ug/mL at 4 hours after ingestion  and >50 ug/mL at 12 hours after  ingestion are often associated with  toxic reactions. Performed at [facility], [address] [place], [place] [address]   cbc     Status: None   Collection Time: 09/03/19  1:47 PM  Result Value Ref Range   WBC 7.9 4.0 - 10.5 K/uL   RBC 4.68 3.87 - 5.11 MIL/uL   Hemoglobin 13.4 12.0 - 15.0 g/dL   HCT 44.0 34.7 - 42.5 %   MCV 91.9 80.0 - 100.0 fL   MCH 28.6 26.0 - 34.0 pg   MCHC 31.2 30.0 - 36.0 g/dL   RDW 95.6 38.7 - 56.4 %   Platelets 320 150 - 400 K/uL   nRBC 0.0 0.0 - 0.2 %    Comment: Performed at [facility], [address], [place], [place] [address]  Urine Drug Screen, Qualitative     Status: Abnormal   Collection Time: 09/03/19  1:47 PM  Result Value Ref Range   Tricyclic, Ur Screen POSITIVE (A) NONE DETECTED   Amphetamines, Ur Screen NONE DETECTED NONE DETECTED   MDMA (Ecstasy)Ur Screen NONE DETECTED NONE DETECTED   Cocaine Metabolite,Ur [place] NONE DETECTED NONE DETECTED   Opiate, Ur Screen NONE DETECTED NONE DETECTED   Phencyclidine (PCP) Ur S NONE DETECTED NONE DETECTED   Cannabinoid 50 Ng, Ur [facility] NONE DETECTED NONE DETECTED   Barbiturates, Ur Screen NONE DETECTED NONE DETECTED   Benzodiazepine, Ur Scrn POSITIVE (A) NONE DETECTED   Methadone Scn, Ur NONE DETECTED NONE DETECTED    Comment: (NOTE) Tricyclics + metabolites, urine    Cutoff 1000 ng/mL Amphetamines + metabolites, urine  Cutoff 1000 ng/mL MDMA (Ecstasy), urine              Cutoff 500 ng/mL Cocaine Metabolite, urine  Cutoff 300 ng/mL Opiate + metabolites, urine        Cutoff 300 ng/mL Phencyclidine (PCP), urine         Cutoff 25 ng/mL Cannabinoid, urine                 Cutoff 50 ng/mL Barbiturates + metabolites, urine  Cutoff 200 ng/mL Benzodiazepine, urine              Cutoff 200 ng/mL Methadone, urine                   Cutoff 300 ng/mL The urine drug screen provides only a preliminary, unconfirmed analytical test result and should not be used for non-medical purposes.  Clinical consideration and professional judgment should be applied to any positive drug screen result due to possible interfering substances. A more specific alternate chemical method must be used in order to obtain a confirmed analytical result. Gas chromatography / mass spectrometry (GC/MS) is the preferred confirmat ory method. Performed at [facility], [address] [place], [place] [address]   SARS Coronavirus 2 by RT PCR (hospital order, performed in [facility] hospital lab) Nasopharyngeal Nasopharyngeal Swab     Status: None   Collection Time: 09/03/19  3:17 PM   Specimen: Nasopharyngeal Swab  Result Value Ref Range   SARS Coronavirus 2 NEGATIVE NEGATIVE    Comment: (NOTE) If result is NEGATIVE SARS-CoV-2 target nucleic acids are NOT DETECTED. The SARS-CoV-2 RNA is generally detectable in upper and lower  respiratory specimens during the acute phase of infection. The lowest  concentration of SARS-CoV-2 viral copies this assay can detect is 250  copies / mL. A negative result does not preclude SARS-CoV-2 infection  and should not be used as the sole basis for treatment or other  patient management decisions.  A negative result may occur with  improper specimen collection / handling, submission of specimen other  than nasopharyngeal swab, presence of viral mutation(s) within the  areas targeted by this assay, and inadequate number of viral copies  (<250 copies / mL). A negative result must be combined with clinical  observations, patient history, and epidemiological information. If result is POSITIVE SARS-CoV-2 target nucleic acids are DETECTED. The SARS-CoV-2 RNA is generally detectable in upper and lower  respiratory specimens dur ing the acute phase of infection.  Positive  results are indicative of active infection with SARS-CoV-2.  Clinical  correlation with patient history and other diagnostic information is  necessary to determine patient infection  status.  Positive results do  not rule out bacterial infection or co-infection with other viruses. If result is PRESUMPTIVE POSTIVE SARS-CoV-2 nucleic acids MAY BE PRESENT.   A presumptive positive result was obtained on the submitted specimen  and confirmed on repeat testing.  While 2019 novel coronavirus  (SARS-CoV-2) nucleic acids may be present in the submitted sample  additional confirmatory testing may be necessary for epidemiological  and / or clinical management purposes  to differentiate between  SARS-CoV-2 and other Sarbecovirus currently known to infect humans.  If clinically indicated additional testing with an alternate test  methodology [phone]([id number]) is advised. The SARS-CoV-2 RNA is generally  detectable in upper and lower respiratory sp ecimens during the acute  phase of infection. The expected result is Negative. Fact Sheet for Patients:  BoilerBrush.com.cyhttps://www.fda.gov/media/136312/download Fact Sheet for Healthcare Providers: https://pope.com/https://www.fda.gov/media/136313/download This test is not yet approved or cleared by the [place] FDA and has been authorized for detection and/or diagnosis of SARS-CoV-2 by FDA under  an Emergency Use Authorization (EUA).  This EUA will remain in effect (meaning this test can be used) for the duration of the COVID-19 declaration under Section 564(b)(1) of the Act, 21 U.S.C. section 360bbb-3(b)(1), unless the authorization is terminated or revoked sooner. Performed at [facility], [address] [place], [place] [address]     Current Facility-Administered Medications  Medication Dose Route Frequency Provider Last Rate Last Dose  . pregabalin (LYRICA) capsule 100 mg  100 mg Oral BID Lonald Troiani, Dorene Ar, MD   100 mg at 09/03/19 1904  . QUEtiapine (SEROQUEL) tablet 400 mg  400 mg Oral QHS Chiyoko Torrico, Dorene Ar, MD   400 mg at 09/03/19 1904   Current Outpatient Medications  Medication Sig Dispense Refill  . albuterol (VENTOLIN HFA) 108 (90 Base)  MCG/ACT inhaler Inhale 1 puff into the lungs every 6 (six) hours as needed for wheezing or shortness of breath.    Marland Kitchen amoxicillin-clavulanate (AUGMENTIN) 875-125 MG tablet Take 1 tablet by mouth 2 (two) times daily for 6 days. 12 tablet 0  . clonazePAM (KLONOPIN) 1 MG tablet Take 1 mg by mouth 2 (two) times daily.    Marland Kitchen escitalopram (LEXAPRO) 20 MG tablet Take 20 mg by mouth daily.    Marland Kitchen levETIRAcetam (KEPPRA) 500 MG tablet Take 500 mg by mouth 2 (two) times daily.    . ondansetron (ZOFRAN ODT) 4 MG disintegrating tablet Take 1 tablet (4 mg total) by mouth every 8 (eight) hours as needed for nausea or vomiting. 20 tablet 0  . pregabalin (LYRICA) 100 MG capsule Take 100 mg by mouth 2 (two) times daily.    . QUEtiapine (SEROQUEL) 200 MG tablet Take 200 mg by mouth daily.    . QUEtiapine (SEROQUEL) 400 MG tablet Take 400 mg by mouth at bedtime.      Musculoskeletal: Strength & Muscle Tone: within normal limits Gait & Station: normal Patient leans: N/A  Psychiatric Specialty Exam: Physical Exam  Review of Systems  Constitutional: Negative.   HENT: Negative.   Skin: Negative.   Psychiatric/Behavioral: Positive for depression. Negative for hallucinations, memory loss, substance abuse and suicidal ideas. The patient is nervous/anxious. The patient does not have insomnia.     Blood pressure 125/87, pulse (!) 101, temperature 98.5 F (36.9 C), resp. rate 16, height 5\' 2"  (1.575 m), weight 74.4 kg, SpO2 96 %.Body mass index is 30 kg/m.  General Appearance: Casual  Eye Contact:  Good  Speech:  Clear and Coherent  Volume:  Normal  Mood:  Euthymic  Affect:  Appropriate  Thought Process:  Coherent  Orientation:  Full (Time, Place, and Person)  Thought Content:  Logical  Suicidal Thoughts:  No  Homicidal Thoughts:  No  Memory:  Immediate;   Fair  Judgement:  Fair  Insight:  Fair  Psychomotor Activity:  Normal  Concentration:  Concentration: Fair  Recall:  [employer] of Knowledge:  Fair   Language:  Fair  Akathisia:  No  Handed:  Right  AIMS (if indicated):     Assets:  Communication Skills Desire for Improvement Housing Intimacy Leisure Time Resilience  ADL's:  Intact  Cognition:  WNL  Sleep:       Assessment: 58 year old female with past psychiatric history of bipolar disorder and borderline personality disorder who presents to the emergency department on an IVC from her outpatient due to concerns for suicide.  On evaluation patient is adamantly denying suicide or any other current psychiatric symptoms.  She will be administered a  dose of Seroquel tonight and observed overnight and reevaluate in the morning.  Diagnosis: Bipolar disorder, borderline personality disorder Medications: Lyrica 100 mg twice daily, Seroquel 400 mg nightly, Lexapro 20 mg daily    Disposition: Reevaluate in the a.m.  Clement Sayres, MD 09/03/2019 8:37 PM

## 2019-09-03 NOTE — ED Notes (Signed)
Pt had gallbladder surgery last week at [facility] and has a drain.  Pt is on oxygen.  Pt is in a hospital gown.  Pt is talkative, alert and oriented.  Pt denies SI or HI.  Pt has multiple abrasions to both arms.

## 2019-09-03 NOTE — ED Notes (Signed)
Patient dressed out by this RN and EDT in hospital provided scrubs. Pt belongings placed in belongings bag and labeled with pt label and green tag. Pt had with them the following belongings:   1 pair of dark blue shoes  1 pair of black footie socks 1 pair of blue jean pants  1 pair blue and white tie-dyed shirt 1 white tank top 1 grey sweat coat 1 vape 1 white bra 1 pair of black leggings 1 pair of tan underwear   1 red inhaler (sent to pharmacy) 1 bottle of zofran (sent to pharmacy) 1 cell phone 1 pink backpack with unicorns on it 1 grey and blue water bottle  Pt belongings taken with patient to quad. Pt calm and cooperative.

## 2019-09-03 NOTE — ED Notes (Signed)
Pt sleeping - NAD.

## 2019-09-03 NOTE — ED Provider Notes (Signed)
[facility] Emergency Department Provider Note       Time seen: ----------------------------------------- 2:23 PM on 09/03/2019 -----------------------------------------   I have reviewed the triage vital signs and the nursing notes.  HISTORY   Chief Complaint Psychiatric Evaluation    HPI Jessica Moran is a 58 y.o. female with a history of anxiety, COPD, hypertension, MI, PTSD, seizures who presents to the ED for involuntary commitment.  Patient presents from [facility].  She states she has been under a lot of stress.  Recently cut both forearms to feel better.  Past Medical History:  Diagnosis Date  . Anxiety   . COPD (chronic obstructive pulmonary disease) (HCC)   . Hypertension   . Menopause   . MI (myocardial infarction) (HCC)   . PTSD (post-traumatic stress disorder)   . Seizures (HCC)   . Stroke (HCC)   . Tobacco abuse   . Tobacco abuse counseling     Patient Active Problem List   Diagnosis Date Noted  . Acute cholecystitis 08/23/2019  . Adjustment disorder with mixed anxiety and depressed mood   . Hypertension 11/11/2012  . Lacunar infarct, acute (HCC) 11/11/2012  . Seizure, epileptic (HCC) 11/09/2012  . Osteoarthritis of hip 07/19/2012  . 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 Menopause   . Tobacco abuse   . Post traumatic stress disorder (PTSD) 09/07/2011  . Tobacco abuse counseling     Past Surgical History:  Procedure Laterality Date  . LAPAROSCOPIC SUPRACERVICAL HYSTERECTOMY  1998   [place]  . SPINE SURGERY      Allergies Ibuprofen  Social History Social History   Tobacco Use  . Smoking status: Former Smoker    Packs/day: 0.50    Types: Cigarettes    Quit date: 11/22/2018    Years since quitting: 0.7  . Smokeless tobacco: Never Used  Substance Use Topics  . Alcohol use: Yes  . Drug use: Yes   Review of Systems Constitutional: Negative for fever. Cardiovascular:  Negative for chest pain. Respiratory: Negative for shortness of breath. Gastrointestinal: Negative for abdominal pain, vomiting and diarrhea. Musculoskeletal: Negative for back pain. Skin: Positive for superficial lacerations Neurological: Negative for headaches, focal weakness or numbness. Psychiatric: Negative for suicidal or homicidal ideation, positive for recent cutting  All systems negative/normal/unremarkable except as stated in the HPI  ____________________________________________   PHYSICAL EXAM:  VITAL SIGNS: ED Triage Vitals  Enc Vitals Group     BP 09/03/19 1346 125/87     Pulse Rate 09/03/19 1341 (!) 101     Resp 09/03/19 1341 16     Temp 09/03/19 1346 98.5 F (36.9 C)     Temp Source 09/03/19 1341 Oral     SpO2 09/03/19 1341 96 %     Weight 09/03/19 1342 164 lb (74.4 kg)     Height 09/03/19 1342 5\' 2"  (1.575 m)     Head Circumference --      Peak Flow --      Pain Score 09/03/19 1342 8     Pain Loc --      Pain Edu? --      Excl. in GC? --    Constitutional: Alert and oriented. Well appearing and in no distress. Eyes: Conjunctivae are normal. Normal extraocular movements. ENT      Head: Normocephalic and atraumatic.      Nose: No congestion/rhinnorhea.      Mouth/Throat: Mucous membranes are moist.  Neck: No stridor. Cardiovascular: Normal rate, regular rhythm. No murmurs, rubs, or gallops. Respiratory: Normal respiratory effort without tachypnea nor retractions. Breath sounds are clear and equal bilaterally. No wheezes/rales/rhonchi. Gastrointestinal: Soft and nontender. Normal bowel sounds Musculoskeletal: Nontender with normal range of motion in extremities. No lower extremity tenderness nor edema. Neurologic:  Normal speech and language. No gross focal neurologic deficits are appreciated.  Skin: Multiple recent superficial and vertically oriented lacerations are noted over the forearms bilaterally Psychiatric: Mood and affect are normal. Speech and  behavior are normal.  ____________________________________________  ED COURSE:  As part of my medical decision making, I reviewed the following data within the [place] History obtained from family if available, nursing notes, old chart and ekg, as well as notes from prior ED visits. Patient presented for involuntary commitment, we will assess with labs as indicated at this time.  We will consult psychiatry.   Procedures  Jessica Moran was evaluated in Emergency Department on 09/03/2019 for the symptoms described in the history of present illness. She was evaluated in the context of the global COVID-19 pandemic, which necessitated consideration that the patient might be at risk for infection with the SARS-CoV-2 virus that causes COVID-19. Institutional protocols and algorithms that pertain to the evaluation of patients at risk for COVID-19 are in a state of rapid change based on information released by regulatory bodies including the CDC and federal and state organizations. These policies and algorithms were followed during the patient's care in the ED.  ____________________________________________   LABS (pertinent positives/negatives)  Labs Reviewed  ACETAMINOPHEN LEVEL - Abnormal; Notable for the following components:      Result Value   Acetaminophen (Tylenol), Serum <10 (*)    All other components within normal limits  SALICYLATE LEVEL  CBC  COMPREHENSIVE METABOLIC PANEL  ETHANOL  URINE DRUG SCREEN, QUALITATIVE ([facility] ONLY)  ____________________________________________   DIFFERENTIAL DIAGNOSIS   Depression, substance abuse, PTSD, suicidal ideation  FINAL ASSESSMENT AND PLAN  Depression, self-mutilation   Plan: The patient had presented for self-mutilation. Patient's labs did not reveal any acute process.  She has been placed under involuntary commitment, she is medically clear for psychiatric evaluation and disposition.   Laurence Aly,  MD    Note: This note was generated in part or whole with voice recognition software. Voice recognition is usually quite accurate but there are transcription errors that can and very often do occur. I apologize for any typographical errors that were not detected and corrected.     Earleen Newport, MD 09/03/19 1438

## 2019-09-04 MED ORDER — CALCIUM CARBONATE ANTACID 500 MG PO CHEW
1.0000 | CHEWABLE_TABLET | Freq: Once | ORAL | Status: AC
  Administered 2019-09-04: 05:00:00 200 mg via ORAL

## 2019-09-04 NOTE — ED Notes (Signed)
Pt sleeping. 

## 2019-09-04 NOTE — Consult Note (Signed)
  Patient reevaluated.  Patient is clear for discharge at this time.  Plan is for patient to follow-up with [facility] outpatient.  IVC rescinded. daughter made aware of plan, will come to pick up mother.

## 2019-09-04 NOTE — ED Notes (Signed)
Pt resting quietly on stretcher in darkened exam room with no distress noted, eyes closed, resp even/unlab

## 2019-09-04 NOTE — ED Notes (Signed)
Report off to lisa t rn.

## 2019-09-04 NOTE — ED Provider Notes (Signed)
Patient has been evaluated by psychiatry and cleared for discharge.   Earleen Newport, MD 09/04/19 1213

## 2019-09-04 NOTE — ED Notes (Signed)
Belongings returned to patient at this time. Pharmacy notified of need of medication that was locked up on patients arrival (zofran and inhaler). Awaiting home medications to be returned.

## 2019-09-04 NOTE — ED Notes (Signed)
Patient resting on stretcher. Denies needs. Will continue to monitor.

## 2019-09-04 NOTE — ED Notes (Signed)
Pt awake, uprite on stretcher with no distress noted; O2 in place at 2l/min via [place] (st wears at home, all times); pt requests tums, stating she has not taken her nexium; MD notified and med ordered; pt given water to drink at request

## 2019-09-05 ENCOUNTER — Emergency Department (HOSPITAL_COMMUNITY)
Admission: EM | Admit: 2019-09-05 | Discharge: 2019-09-05 | Disposition: A | Discharge status: Home / Self Care | Payer: Self-pay | Attending: Emergency Medicine | Admitting: Emergency Medicine

## 2019-09-05 ENCOUNTER — Encounter (HOSPITAL_COMMUNITY): Payer: Self-pay | Admitting: Emergency Medicine

## 2019-09-05 ENCOUNTER — Emergency Department (HOSPITAL_COMMUNITY): Payer: Self-pay

## 2019-09-05 ENCOUNTER — Other Ambulatory Visit: Payer: Self-pay

## 2019-09-05 DIAGNOSIS — R0602 Shortness of breath: Secondary | ICD-10-CM | POA: Insufficient documentation

## 2019-09-05 DIAGNOSIS — J441 Chronic obstructive pulmonary disease with (acute) exacerbation: Secondary | ICD-10-CM | POA: Insufficient documentation

## 2019-09-05 DIAGNOSIS — Z8673 Personal history of transient ischemic attack (TIA), and cerebral infarction without residual deficits: Secondary | ICD-10-CM | POA: Insufficient documentation

## 2019-09-05 DIAGNOSIS — I10 Essential (primary) hypertension: Secondary | ICD-10-CM | POA: Insufficient documentation

## 2019-09-05 DIAGNOSIS — Z87891 Personal history of nicotine dependence: Secondary | ICD-10-CM | POA: Insufficient documentation

## 2019-09-05 DIAGNOSIS — Z79899 Other long term (current) drug therapy: Secondary | ICD-10-CM | POA: Insufficient documentation

## 2019-09-05 DIAGNOSIS — R911 Solitary pulmonary nodule: Secondary | ICD-10-CM | POA: Insufficient documentation

## 2019-09-05 LAB — COMPREHENSIVE METABOLIC PANEL
ALT: 103 U/L — ABNORMAL HIGH (ref 0–44)
AST: 61 U/L — ABNORMAL HIGH (ref 15–41)
Albumin: 3.5 g/dL (ref 3.5–5.0)
Alkaline Phosphatase: 101 U/L (ref 38–126)
Anion gap: 12 (ref 5–15)
BUN: 13 mg/dL (ref 6–20)
CO2: 30 mmol/L (ref 22–32)
Calcium: 8.7 mg/dL — ABNORMAL LOW (ref 8.9–10.3)
Chloride: 103 mmol/L (ref 98–111)
Creatinine, Ser: 0.93 mg/dL (ref 0.44–1.00)
GFR calc Af Amer: >60 mL/min (ref >60)
GFR calc non Af Amer: >60 mL/min (ref >60)
Glucose, Bld: 174 mg/dL — ABNORMAL HIGH (ref 70–99)
Potassium: 3.4 mmol/L — ABNORMAL LOW (ref 3.5–5.1)
Sodium: 145 mmol/L (ref 135–145)
Total Bilirubin: 0.3 mg/dL (ref 0.3–1.2)
Total Protein: 6.3 g/dL — ABNORMAL LOW (ref 6.5–8.1)

## 2019-09-05 LAB — CBC WITH DIFFERENTIAL/PLATELET
Abs Immature Granulocytes: 0.02 10*3/uL (ref 0.00–0.07)
Basophils Absolute: 0 10*3/uL (ref 0.0–0.1)
Basophils Relative: 1 %
Eosinophils Absolute: 0.4 10*3/uL (ref 0.0–0.5)
Eosinophils Relative: 5 %
HCT: 37.8 % (ref 36.0–46.0)
Hemoglobin: 11.8 g/dL — ABNORMAL LOW (ref 12.0–15.0)
Immature Granulocytes: 0 %
Lymphocytes Relative: 33 %
Lymphs Abs: 2.6 10*3/uL (ref 0.7–4.0)
MCH: 29.6 pg (ref 26.0–34.0)
MCHC: 31.2 g/dL (ref 30.0–36.0)
MCV: 95 fL (ref 80.0–100.0)
Monocytes Absolute: 0.6 10*3/uL (ref 0.1–1.0)
Monocytes Relative: 8 %
Neutro Abs: 4.4 10*3/uL (ref 1.7–7.7)
Neutrophils Relative %: 53 %
Platelets: 233 10*3/uL (ref 150–400)
RBC: 3.98 MIL/uL (ref 3.87–5.11)
RDW: 13.7 % (ref 11.5–15.5)
WBC: 8.1 10*3/uL (ref 4.0–10.5)
nRBC: 0 % (ref 0.0–0.2)

## 2019-09-05 MED ORDER — IPRATROPIUM BROMIDE HFA 17 MCG/ACT IN AERS
4.0000 | INHALATION_SPRAY | Freq: Once | RESPIRATORY_TRACT | Status: AC
  Administered 2019-09-05: 08:00:00 4 via RESPIRATORY_TRACT
  Filled 2019-09-05: qty 12.9

## 2019-09-05 MED ORDER — MAGNESIUM SULFATE 2 GM/50ML IV SOLN
2.0000 g | Freq: Once | INTRAVENOUS | Status: AC
  Administered 2019-09-05: 08:00:00 2 g via INTRAVENOUS
  Filled 2019-09-05: qty 50

## 2019-09-05 MED ORDER — PREDNISONE 20 MG PO TABS: 40.0000 mg | ORAL_TABLET | Freq: Every day | ORAL | 0 refills | Status: AC

## 2019-09-05 MED ORDER — ALBUTEROL SULFATE HFA 108 (90 BASE) MCG/ACT IN AERS
4.0000 | INHALATION_SPRAY | Freq: Once | RESPIRATORY_TRACT | Status: AC
  Administered 2019-09-05: 08:00:00 4 via RESPIRATORY_TRACT
  Filled 2019-09-05: qty 6.7

## 2019-09-05 NOTE — ED Provider Notes (Signed)
MSE was initiated and I personally evaluated the patient and placed orders (if any) at  6:16 AM on September 05, 2019.  The patient appears stable so that the remainder of the MSE may be completed by another provider.    Lisanne Ponce, Corene Cornea, MD 09/05/19 [phone]

## 2019-09-05 NOTE — ED Notes (Signed)
Pt successfully weaned to Nasal cannunla @ 1 L. 94%

## 2019-09-05 NOTE — ED Notes (Signed)
Pt reports she will take the bus to get home. She is A&OX4, ambulatory in room with independent steady gait, able to dress herself independently.

## 2019-09-05 NOTE — ED Triage Notes (Signed)
Per EMS- pt currently living in hotel. Found by worker sleeping on bench, short of breath. Pt arrives with her dog to ED for shortness of breath and wheezing. Pt had covid in March. PT has COPD. Given 125 solumedrol, 8 puff of albuterol. 18GPIV to [place]. Pt was 50% on room air. On non rebreather upon arrival.

## 2019-09-05 NOTE — ED Notes (Signed)
Pt A&OX4, ambulatory at d/c with independent steady gait, NAD. Pt verbalized understanding of d/c instructions, prescription and follow up care. 

## 2019-09-05 NOTE — ED Notes (Signed)
Called [facility] to cancel pt transport

## 2019-09-05 NOTE — ED Notes (Signed)
Called [facility] for pt transport back to home.  

## 2019-09-05 NOTE — Discharge Instructions (Addendum)
Continue taking home medications as prescribed. Use your oxygen as prescribed, especially at night. Take prednisone daily for the next 4 days, starting tomorrow. Follow-up with your primary care doctor for recheck of your symptoms next week. Return to the emergency room if you develop increased shortness of breath it is not improving with your inhalers, worsening chest pain, or any new, worsening, or concerning symptoms.  Your x-ray today showed a lesion in your right lung, they cannot say exactly what it is.  It is recommended that she get a repeat chest x-ray in 4 to 6 weeks for further evaluation.

## 2019-09-05 NOTE — ED Provider Notes (Addendum)
[facility] Provider Note   CSN: [id number] Arrival date & time: 09/05/19  [id number]     History   Chief Complaint Chief Complaint  Patient presents with  . Shortness of Breath    HPI Jessica BowLeona Gus PumaKing Moran is a 58 y.o. female presenting for evaluation of shortness of breath.  Patient states she is supposed to wear oxygen at night, but was not wearing it last night.  She was awoken from sleep by her dog, and was very short of breath.  She reports feeling tightness throughout her chest, but no pain.  She has a history of COPD, states this feels like a COPD exacerbation.  Patient states her symptoms improved with EMS when she was given breathing treatments and Solu-Medrol.  She denies recent fevers, chills, nasal congestion, sore throat, cough, chest pain, nausea, vomiting, abdominal pain, urinary symptoms, abnormal bowel movements.  Patient states she was recently hospitalized for gallbladder issue in which a GB drain was placed.  Patient states she has been going through a lot of family issues, is planning on leaving today to [place] where she has good follow-up for primary care and can get replacements for her oxygen.  Patient was very depressed several days ago, went to [facility] after cutting her arms.  She was cleared by psych and discharged yesterday.  She reports no current SI, HI, or AVH.  Patient reports a history of A. fib, is not on anticoagulation as she was on aspirin but this was discontinued due to her bile drain placement.  She also reports a history of previous CVAs, CHF, anxiety, hypertension, and seizures.  She has been taking all her medication as prescribed.  She is not on Lasix or other fluid pills.  She did not take any of her home medicines prior to calling EMS.  Additional history obtained from chart review.  Per EMS, patient was found hypoxic in the 50s upon arrival, however improved with treatment and supplemental oxygen.  Patient  was successfully weaned to 1 L via nasal cannula without worsening shortness of breath.     HPI  Past Medical History:  Diagnosis Date  . Anxiety   . COPD (chronic obstructive pulmonary disease) (HCC)   . Hypertension   . Menopause   . MI (myocardial infarction) (HCC)   . PTSD (post-traumatic stress disorder)   . Seizures (HCC)   . Stroke (HCC)   . Tobacco abuse   . Tobacco abuse counseling     Patient Active Problem List   Diagnosis Date Noted  . Bipolar 1 disorder (HCC)   . Acute cholecystitis 08/23/2019  . Adjustment disorder with mixed anxiety and depressed mood   . Hypertension 11/11/2012  . Lacunar infarct, acute (HCC) 11/11/2012  . Seizure, epileptic (HCC) 11/09/2012  . Osteoarthritis of hip 07/19/2012  . 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 Menopause   . Tobacco abuse   . Post traumatic stress disorder (PTSD) 09/07/2011  . Tobacco abuse counseling     Past Surgical History:  Procedure Laterality Date  . LAPAROSCOPIC SUPRACERVICAL HYSTERECTOMY  1998   [place]  . SPINE SURGERY       OB History   No obstetric history on file.      Home Medications    Prior to Admission medications   Medication Sig Start Date End Date Taking? Authorizing Provider  albuterol (VENTOLIN HFA) 108 (90 Base) MCG/ACT inhaler Inhale 1 puff into the lungs every 6 (six) hours as needed for  wheezing or shortness of breath.    [provider]  clonazePAM (KLONOPIN) 1 MG tablet Take 1 mg by mouth 2 (two) times daily.    [provider]  escitalopram (LEXAPRO) 20 MG tablet Take 20 mg by mouth daily.    [provider]  levETIRAcetam (KEPPRA) 500 MG tablet Take 500 mg by mouth 2 (two) times daily.    [provider]  ondansetron (ZOFRAN ODT) 4 MG disintegrating tablet Take 1 tablet (4 mg total) by mouth every 8 (eight) hours as needed for nausea or vomiting. 08/29/19   Tonna Boehringer, Isami, DO  predniSONE (DELTASONE) 20 MG tablet Take 2 tablets (40 mg  total) by mouth daily for 4 days. 09/05/19 09/09/19  Terea Neubauer, PA-C  pregabalin (LYRICA) 100 MG capsule Take 100 mg by mouth 2 (two) times daily.    [provider]  QUEtiapine (SEROQUEL) 200 MG tablet Take 200 mg by mouth daily.    [provider]  QUEtiapine (SEROQUEL) 400 MG tablet Take 400 mg by mouth at bedtime.    [provider]    Family History Family History  Problem Relation Age of Onset  . Stroke Neg Hx   . Hearing loss Neg Hx     Social History Social History   Tobacco Use  . Smoking status: Former Smoker    Packs/day: 0.50    Types: Cigarettes    Quit date: 11/22/2018    Years since quitting: 0.7  . Smokeless tobacco: Never Used  Substance Use Topics  . Alcohol use: Yes  . Drug use: Yes     Allergies   Ibuprofen   Review of Systems Review of Systems  Respiratory: Positive for chest tightness, shortness of breath and wheezing.   All other systems reviewed and are negative.    Physical Exam Updated Vital Signs BP 114/72   Pulse (!) 101   Temp 98.2 F (36.8 C)   Resp 12   SpO2 95%   Physical Exam Vitals signs and nursing note reviewed.  Constitutional:      General: She is not in acute distress.    Appearance: She is well-developed.     Comments: Appears older than stated age, nontoxic in appearance  HENT:     Head: Normocephalic and atraumatic.  Eyes:     Extraocular Movements: Extraocular movements intact.     Conjunctiva/sclera: Conjunctivae normal.     Pupils: Pupils are equal, round, and reactive to light.  Neck:     Musculoskeletal: Normal range of motion and neck supple.  Cardiovascular:     Rate and Rhythm: Regular rhythm. Tachycardia present.     Pulses: Normal pulses.     Comments: Heart rate mildly tachycardic around 105.  Regular. Pulmonary:     Effort: Pulmonary effort is normal. No accessory muscle usage or respiratory distress.     Breath sounds: Decreased breath sounds and wheezing  present.     Comments: Speaking in full sentences.  Sats 94 to 97% on 1 L via nasal cannula.  Slight decrease lung sounds with expiratory wheezing throughout. Abdominal:     General: Bowel sounds are normal. There is no distension.     Palpations: Abdomen is soft.     Tenderness: There is no abdominal tenderness.  Musculoskeletal: Normal range of motion.     Right lower leg: No edema.     Left lower leg: No edema.     Comments: No leg pain or swelling  Skin:  General: Skin is warm and dry.     Capillary Refill: Capillary refill takes less than 2 seconds.  Neurological:     Mental Status: She is alert and oriented to person, place, and time.      ED Treatments / Results  Labs (all labs ordered are listed, but only abnormal results are displayed) Labs Reviewed  CBC WITH DIFFERENTIAL/PLATELET - Abnormal; Notable for the following components:      Result Value   Hemoglobin 11.8 (*)    All other components within normal limits  COMPREHENSIVE METABOLIC PANEL - Abnormal; Notable for the following components:   Potassium 3.4 (*)    Glucose, Bld 174 (*)    Calcium 8.7 (*)    Total Protein 6.3 (*)    AST 61 (*)    ALT 103 (*)    All other components within normal limits    EKG EKG Interpretation  Date/Time:  Wednesday September 05 2019 06:02:39 EST Ventricular Rate:  111 PR Interval:    QRS Duration: 96 QT Interval:  315 QTC Calculation: 428 R Axis:   77 Text Interpretation: Sinus tachycardia Borderline T abnormalities, diffuse leads When compared to prior, no significant changes seen. No STEMI Confirmed by Antony Blackbird [phone]) on 09/05/2019 7:16:27 AM   Radiology Dg Chest Portable 1 View  Result Date: 09/05/2019 CLINICAL DATA:  Pneumonia EXAM: PORTABLE CHEST 1 VIEW COMPARISON:  None. FINDINGS: There is no large focal infiltrate. No pneumothorax. No significant pleural effusion. The heart size is normal. There is a subtle opacity at the right lung apex. The patient is  status post ACDF of the lower cervical spine. IMPRESSION: 1. No definite acute cardiopulmonary process. 2. Subtle density at the right lung apex of unknown clinical significance. A follow-up two-view chest x-ray is recommended in 4-6 weeks to confirm stability or resolution of this finding. Electronically Signed   By: Constance Holster M.D.   On: 09/05/2019 06:39    Procedures Procedures (including critical care time)  Medications Ordered in ED Medications  magnesium sulfate IVPB 2 g 50 mL (2 g Intravenous New Bag/Given 09/05/19 0750)  albuterol (VENTOLIN HFA) 108 (90 Base) MCG/ACT inhaler 4 puff (4 puffs Inhalation Given 09/05/19 0750)  ipratropium (ATROVENT HFA) inhaler 4 puff (4 puffs Inhalation Given 09/05/19 0750)     Initial Impression / Assessment and Plan / ED Course  I have reviewed the triage vital signs and the nursing notes.  Pertinent labs & imaging results that were available during my care of the patient were reviewed by me and considered in my medical decision making (see chart for details).         Patient resenting for evaluation of chest tightness, shortness of breath, wheezing.  Physical exam reassuring, patient appears much improved when compared to initial EMS evaluation.  No signs of respiratory distress.  History most likely consistent with COPD exacerbation that worsened when patient was off oxygen overnight.  However, considering her history of CHF, A. fib not on anticoagulation, previous strokes, and recent surgery, also consider CHF exacerbation, pleural effusion, PE, pneumonia.  As there is no chest pain, lower suspicion for ACS.  Chest x-ray viewed interpreted by me, no pneumonia, pneumothorax, effusion.  Per radiology read, there is concern for lesion of the apex of the right lobe, recommends repeat cxr in 4-6 wks. Discussed with pt who is aware and will f/u as recommended. Labs reassuring, slightly elevated LFTs which is baseline per chart review.  No  leukocytosis.  Electrolytes stable.  EKG unchanged from previous.  Case discussed with attending, Dr. Rush Landmark agrees to plan.  Will treat symptomatically and reassess.  On reassessment, patient reports continued improvement of respiratory status.  Wheezing is improved.  Upon my evaluation, heart rate 97.  Discussed risks of PE with patient considering her age, her history of A. fib, her recent surgery, and her acute onset shortness of breath.  Offered further lab tests including dimer and imaging including CTA.  Patient states she is convinced this is a COPD exacerbation, does not want further work-up for PE at this time.  I discussed risks of an undiagnosed PE including debility and death.  Patient states she understands, does not want further investigation at this time.  On reassessment, patient reports continued improvement of her breathing status.  She remained stable on my exam.  As such, she can be discharged for presumed COPD exacerbation with prednisone and inhalers.  Patient planning on following up with her primary doctor out of town.  At this time, patient appears safe for discharge.  Return precautions given.  Patient states she understands and agrees to plan.  Final Clinical Impressions(s) / ED Diagnoses   Final diagnoses:  SOB (shortness of breath)  COPD exacerbation (HCC)  Lesion of right lung    ED Discharge Orders         Ordered    predniSONE (DELTASONE) 20 MG tablet  Daily     09/05/19 0842           Alveria Apley, PA-C 09/05/19 0623    Alveria Apley, PA-C 09/05/19 7628    Tegeler, Canary Brim, MD 09/05/19 1114

## 2019-09-13 ENCOUNTER — Ambulatory Visit: Payer: Self-pay | Admitting: Surgery

## 2019-09-13 NOTE — H&P (Signed)
Subjective:   CC: Chronic cholecystitis [K81.1] POSTOP  HPI:  Jessica Moran is a 58 y.o. female who is here for followup from above.  No issues since d/c except for constipation.  Pain has mostly resolved in RUQ.     Current Medications: has a current medication list which includes the following prescription(s): albuterol 2.5 mg /3 mL (0.083 %) Nebu 3 mL, albuterol 5 mg/mL Nebu 0.5 mL aerosol nebs, cyanocobalamin, escitalopram oxalate, esomeprazole, ipratropium, melatonin, multivitamin with minerals, nitroglycerin, pregabalin, and quetiapine.  Allergies:  No Known Allergies  ROS: General: Denies weight loss, weight gain, fatigue, fevers, chills, and night sweats. Heart: Denies chest pain, palpitations, racing heart, irregular heartbeat, leg pain or swelling, and decreased activity tolerance. Respiratory: Denies breathing difficulty, shortness of breath, wheezing, cough, and sputum. GI: Denies change in appetite, heartburn, nausea, vomiting, diarrhea, and blood in stool. GU: Denies difficulty urinating, pain with urinating, urgency, frequency, blood in urine    Objective:   BP (!) 129/94   Pulse 110   Ht 160 cm (5' 3")   Wt 74.4 kg (164 lb)   BMI 29.05 kg/m   Constitutional :  alert, appears stated age, cooperative and no distress  Gastrointestinal: soft, non-tender; bowel sounds normal; no masses,  no organomegaly.  Chole tube with bilious output  Musculoskeletal: Steady gait and movement  Skin: Cool and moist    Psychiatric: Normal affect, non-agitated, not confused       LABS:  N/A   RADS: N/A  Assessment:      Chronic cholecystitis [K81.1]  S/p cholecystostomy tube placement  Plan:   1. Will proceed with interval chole 6-8wks out from initial episode.  Discussed the risk of surgery including post-op infxn, seroma, biloma, chronic pain, poor-delayed wound healing, retained gallstone, conversion to open procedure, post-op SBO or ileus, and need for  additional procedures to address said risks.  The risks of general anesthetic including MI, CVA, sudden death or even reaction to anesthetic medications also discussed. Alternatives include continued observation.  Benefits include possible symptom relief, prevention of complications including acute cholecystitis, pancreatitis.  Typical post operative recovery of 3-5 days rest, continued pain in area and incision sites, possible loose stools up to 4-6 weeks, also discussed.  ED return precautions given for sudden increase in RUQ pain, with possible accompanying fever, nausea, and/or vomiting.  The patient understands the risks, any and all questions were answered to the patient's satisfaction.        

## 2019-09-13 NOTE — H&P (View-Only) (Signed)
Subjective:   CC: Chronic cholecystitis [K81.1] POSTOP  HPI:  Jessica Moran is a 58 y.o. female who is here for followup from above.  No issues since d/c except for constipation.  Pain has mostly resolved in RUQ.     Current Medications: has a current medication list which includes the following prescription(s): albuterol 2.5 mg /3 mL (0.083 %) Nebu 3 mL, albuterol 5 mg/mL Nebu 0.5 mL aerosol nebs, cyanocobalamin, escitalopram oxalate, esomeprazole, ipratropium, melatonin, multivitamin with minerals, nitroglycerin, pregabalin, and quetiapine.  Allergies:  No Known Allergies  ROS: General: Denies weight loss, weight gain, fatigue, fevers, chills, and night sweats. Heart: Denies chest pain, palpitations, racing heart, irregular heartbeat, leg pain or swelling, and decreased activity tolerance. Respiratory: Denies breathing difficulty, shortness of breath, wheezing, cough, and sputum. GI: Denies change in appetite, heartburn, nausea, vomiting, diarrhea, and blood in stool. GU: Denies difficulty urinating, pain with urinating, urgency, frequency, blood in urine    Objective:   BP (!) 129/94   Pulse 110   Ht 160 cm (5\' 3" )   Wt 74.4 kg (164 lb)   BMI 29.05 kg/m   Constitutional :  alert, appears stated age, cooperative and no distress  Gastrointestinal: soft, non-tender; bowel sounds normal; no masses,  no organomegaly.  Chole tube with bilious output  Musculoskeletal: Steady gait and movement  Skin: Cool and moist    Psychiatric: Normal affect, non-agitated, not confused       LABS:  N/A   RADS: N/A  Assessment:      Chronic cholecystitis [K81.1]  S/p cholecystostomy tube placement  Plan:   1. Will proceed with interval chole 6-8wks out from initial episode.  Discussed the risk of surgery including post-op infxn, seroma, biloma, chronic pain, poor-delayed wound healing, retained gallstone, conversion to open procedure, post-op SBO or ileus, and need for  additional procedures to address said risks.  The risks of general anesthetic including MI, CVA, sudden death or even reaction to anesthetic medications also discussed. Alternatives include continued observation.  Benefits include possible symptom relief, prevention of complications including acute cholecystitis, pancreatitis.  Typical post operative recovery of 3-5 days rest, continued pain in area and incision sites, possible loose stools up to 4-6 weeks, also discussed.  ED return precautions given for sudden increase in RUQ pain, with possible accompanying fever, nausea, and/or vomiting.  The patient understands the risks, any and all questions were answered to the patient's satisfaction.

## 2019-09-19 ENCOUNTER — Emergency Department
Admission: EM | Admit: 2019-09-19 | Discharge: 2019-09-20 | Disposition: A | Discharge status: Home / Self Care | Payer: Self-pay | Attending: Emergency Medicine | Admitting: Emergency Medicine

## 2019-09-19 ENCOUNTER — Other Ambulatory Visit: Payer: Self-pay

## 2019-09-19 ENCOUNTER — Encounter: Payer: Self-pay | Admitting: Emergency Medicine

## 2019-09-19 DIAGNOSIS — Z8673 Personal history of transient ischemic attack (TIA), and cerebral infarction without residual deficits: Secondary | ICD-10-CM | POA: Insufficient documentation

## 2019-09-19 DIAGNOSIS — R1011 Right upper quadrant pain: Secondary | ICD-10-CM | POA: Insufficient documentation

## 2019-09-19 DIAGNOSIS — I252 Old myocardial infarction: Secondary | ICD-10-CM | POA: Insufficient documentation

## 2019-09-19 DIAGNOSIS — I10 Essential (primary) hypertension: Secondary | ICD-10-CM | POA: Insufficient documentation

## 2019-09-19 DIAGNOSIS — Z87891 Personal history of nicotine dependence: Secondary | ICD-10-CM | POA: Insufficient documentation

## 2019-09-19 DIAGNOSIS — Z79899 Other long term (current) drug therapy: Secondary | ICD-10-CM | POA: Insufficient documentation

## 2019-09-19 DIAGNOSIS — Z9889 Other specified postprocedural states: Secondary | ICD-10-CM | POA: Insufficient documentation

## 2019-09-19 DIAGNOSIS — J449 Chronic obstructive pulmonary disease, unspecified: Secondary | ICD-10-CM | POA: Insufficient documentation

## 2019-09-19 LAB — CBC WITH DIFFERENTIAL/PLATELET
Abs Immature Granulocytes: 0.03 10*3/uL (ref 0.00–0.07)
Basophils Absolute: 0 10*3/uL (ref 0.0–0.1)
Basophils Relative: 0 %
Eosinophils Absolute: 0.4 10*3/uL (ref 0.0–0.5)
Eosinophils Relative: 4 %
HCT: 38.1 % (ref 36.0–46.0)
Hemoglobin: 12 g/dL (ref 12.0–15.0)
Immature Granulocytes: 0 %
Lymphocytes Relative: 18 %
Lymphs Abs: 2.1 10*3/uL (ref 0.7–4.0)
MCH: 29.3 pg (ref 26.0–34.0)
MCHC: 31.5 g/dL (ref 30.0–36.0)
MCV: 92.9 fL (ref 80.0–100.0)
Monocytes Absolute: 0.8 10*3/uL (ref 0.1–1.0)
Monocytes Relative: 7 %
Neutro Abs: 7.9 10*3/uL — ABNORMAL HIGH (ref 1.7–7.7)
Neutrophils Relative %: 71 %
Platelets: 213 10*3/uL (ref 150–400)
RBC: 4.1 MIL/uL (ref 3.87–5.11)
RDW: 14.1 % (ref 11.5–15.5)
WBC: 11.3 10*3/uL — ABNORMAL HIGH (ref 4.0–10.5)
nRBC: 0 % (ref 0.0–0.2)

## 2019-09-19 LAB — COMPREHENSIVE METABOLIC PANEL
ALT: 74 U/L — ABNORMAL HIGH (ref 0–44)
AST: 44 U/L — ABNORMAL HIGH (ref 15–41)
Albumin: 3.7 g/dL (ref 3.5–5.0)
Alkaline Phosphatase: 121 U/L (ref 38–126)
Anion gap: 12 (ref 5–15)
BUN: 11 mg/dL (ref 6–20)
CO2: 27 mmol/L (ref 22–32)
Calcium: 9.5 mg/dL (ref 8.9–10.3)
Chloride: 102 mmol/L (ref 98–111)
Creatinine, Ser: 0.7 mg/dL (ref 0.44–1.00)
GFR calc Af Amer: >60 mL/min (ref >60)
GFR calc non Af Amer: >60 mL/min (ref >60)
Glucose, Bld: 101 mg/dL — ABNORMAL HIGH (ref 70–99)
Potassium: 4.3 mmol/L (ref 3.5–5.1)
Sodium: 141 mmol/L (ref 135–145)
Total Bilirubin: 0.6 mg/dL (ref 0.3–1.2)
Total Protein: 7.2 g/dL (ref 6.5–8.1)

## 2019-09-19 LAB — LACTIC ACID, PLASMA: Lactic Acid, Venous: 1 mmol/L (ref 0.5–1.9)

## 2019-09-19 MED ORDER — ONDANSETRON HCL 4 MG/2ML IJ SOLN
4.0000 mg | Freq: Once | INTRAMUSCULAR | Status: AC
  Administered 2019-09-20: 4 mg via INTRAVENOUS
  Filled 2019-09-19: qty 2

## 2019-09-19 MED ORDER — SODIUM CHLORIDE 0.9 % IV BOLUS
1000.0000 mL | Freq: Once | INTRAVENOUS | Status: AC
  Administered 2019-09-19: 1000 mL via INTRAVENOUS

## 2019-09-19 MED ORDER — MORPHINE SULFATE (PF) 2 MG/ML IV SOLN
2.0000 mg | Freq: Once | INTRAVENOUS | Status: AC
  Administered 2019-09-20: 2 mg via INTRAVENOUS
  Filled 2019-09-19: qty 1

## 2019-09-19 NOTE — ED Provider Notes (Addendum)
[facility] Emergency Department Provider Note  ____________________________________________  Time seen: Approximately 6:25 PM  The following is a medical screening exam note. It is intended that the patient await an ER room assignment for detailed exam, diagnosis, and disposition.  I have reviewed the triage vital signs.    HISTORY  Chief Complaint Post-op Problem   HPI Jessica Moran is a 58 y.o. female presenting to the ER for increased abdominal pain since yesterday.  Patient had a cholecystotomy tube placed for chronic cholecystitis.  She has been doing well until yesterday.  She noticed that she had increase in pain with new onset of nausea.  She denies fever.  She was advised to come to the emergency department by her surgeon Dr. Lysle Pearl.  Patient denies any diarrhea constipation.  Of note patient states that drainage from her cholecystostomy tube increased since yesterday.  Patient states that she has been taking ibuprofen at home for pain without any relief.  Patient states that the pain is worse with movement and relieved with being still".   Past Medical History:  Diagnosis Date  . Anxiety   . COPD (chronic obstructive pulmonary disease) ([place])   . Hypertension   . Menopause   . MI (myocardial infarction) ([place])   . PTSD (post-traumatic stress disorder)   . Seizures ([place])   . Stroke ([place])   . Tobacco abuse   . Tobacco abuse counseling     Patient Active Problem List   Diagnosis Date Noted  . Bipolar 1 disorder ([place])   . Acute cholecystitis 08/23/2019  . Adjustment disorder with mixed anxiety and depressed mood   . Hypertension 11/11/2012  . Lacunar infarct, acute ([place]) 11/11/2012  . Seizure, epileptic ([place]) 11/09/2012  . Osteoarthritis of hip 07/19/2012  . 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 Menopause   . Tobacco abuse   . Post traumatic stress disorder (PTSD) 09/07/2011  . Tobacco abuse counseling     Past Surgical History:   Procedure Laterality Date  . [place]  . SPINE SURGERY      Prior to Admission medications   Medication Sig Start Date End Date Taking? Authorizing Provider  albuterol (VENTOLIN HFA) 108 (90 Base) MCG/ACT inhaler Inhale 1 puff into the lungs every 6 (six) hours as needed for wheezing or shortness of breath.    [provider]  clonazePAM (KLONOPIN) 1 MG tablet Take 1 mg by mouth 2 (two) times daily.    [provider]  escitalopram (LEXAPRO) 20 MG tablet Take 20 mg by mouth daily.    [provider]  levETIRAcetam (KEPPRA) 500 MG tablet Take 500 mg by mouth 2 (two) times daily.    [provider]  ondansetron (ZOFRAN ODT) 4 MG disintegrating tablet Take 1 tablet (4 mg total) by mouth every 8 (eight) hours as needed for nausea or vomiting. 08/29/19   Lysle Pearl, Isami, DO  oxyCODONE (ROXICODONE) 5 MG immediate release tablet Take 1 tablet (5 mg total) by mouth every 8 (eight) hours as needed. 09/20/19 09/19/20  Gregor Hams, MD  pregabalin (LYRICA) 100 MG capsule Take 100 mg by mouth 2 (two) times daily.    [provider]  QUEtiapine (SEROQUEL) 200 MG tablet Take 200 mg by mouth daily.    [provider]  QUEtiapine (SEROQUEL) 400 MG tablet Take 400 mg by mouth at bedtime.    [provider]    Allergies Ibuprofen  Family History  Problem Relation Age of Onset  . Stroke Neg Hx   . Hearing loss Neg Hx     Social History Social History   Tobacco Use  . Smoking status: Former Smoker    Packs/day: 0.50    Types: Cigarettes    Quit date: 11/22/2018    Years since quitting: 0.8  . Smokeless tobacco: Never Used  Substance Use Topics  . Alcohol use: Yes  . Drug use: Yes    Review of Systems Constitutional: Negative for fever. ENT: Negative for sore throat. Respiratory: Negative for shortness of breath Gastrointestinal: Positive for abdominal pain and  nausea. Musculoskeletal: Negative for generalized body aches. Skin: Negative for rash/lesion/wound. Neurological: Negative for headaches, focal weakness or numbness.  ____________________________________________   PHYSICAL EXAM:  VITAL SIGNS:  Today's Vitals   09/20/19 0230 09/20/19 0330 09/20/19 0345 09/20/19 0400  BP: 100/61 115/81    Pulse: 87  87   Resp:      Temp:      TempSrc:      SpO2: 95%  100%   PainSc:    0-No pain   There is no height or weight on file to calculate BMI.   Constitutional: Alert and oriented. No acute distress. Head: Atraumatic. Nose: No congestion/rhinnorhea. Mouth/Throat: Mucous membranes are moist. Neck: No stridor.  Cardiovascular: Good peripheral circulation. Respiratory: Normal respiratory effort. Musculoskeletal: No restriction Gastro: Left upper quadrant/right upper quadrant tenderness to mild palpation. Neurologic:  Normal speech and language. No gross focal neurologic deficits are appreciated. Speech is normal. No gait instability. Skin:  Drain securely sutured to right abdomen with focal, mild erythema at insertion site. No obvious drainage. Psychiatric: Mood and affect are normal. Speech and behavior are normal.  ____________________________________________   LABS (all labs ordered are listed, but only abnormal results are displayed)  Labs Reviewed  COMPREHENSIVE METABOLIC PANEL - Abnormal; Notable for the following components:      Result Value   Glucose, Bld 101 (*)    AST 44 (*)    ALT 74 (*)    All other components within normal limits  CBC WITH DIFFERENTIAL/PLATELET - Abnormal; Notable for the following components:   WBC 11.3 (*)    Neutro Abs 7.9 (*)    All other components within normal limits  LACTIC ACID, PLASMA  URINALYSIS, COMPLETE (UACMP) WITH MICROSCOPIC   ____________________________________________  EKG  Not indicated ____________________________________________   INITIAL CLINICAL IMPRESSION(S)    Postoperative abdominal pain. Possible sepsis. ----------------------------------------- 7:20 PM on 09/19/2019 -----------------------------------------  Continue to await room assignment. Blood pressure now 117/76. RNs are moving her to [place].    Chinita Pester, FNP 09/19/19 3127 58 year old female presented with above-stated history and physical exam secondary to worsening right upper quadrant abdominal pain.  Right upper quadrant ultrasound revealed cholecystostomy tube in good position without any other gross abnormality CT scan likewise.  Patient given 2 mg of IV morphine with resolution of pain.  Will prescribe oxycodone for home and advised the patient to follow-up with Dr. Tonna Boehringer today.       Darci Current, MD 09/20/19 [phone]

## 2019-09-19 NOTE — ED Triage Notes (Signed)
PT from home via EMS , states drained placed in gallbladder on 11/11. PT states increased pain and nausea. Vladimir Crofts, NP at bedside in triage for orders

## 2019-09-20 ENCOUNTER — Emergency Department: Payer: Self-pay

## 2019-09-20 MED ORDER — IOHEXOL 300 MG/ML  SOLN
100.0000 mL | Freq: Once | INTRAMUSCULAR | Status: AC | PRN
  Administered 2019-09-20: 100 mL via INTRAVENOUS

## 2019-09-20 MED ORDER — OXYCODONE HCL 5 MG PO TABS: 5.0000 mg | ORAL_TABLET | Freq: Three times a day (TID) | ORAL | 0 refills | Status: DC | PRN

## 2019-09-20 NOTE — ED Notes (Signed)
Pt placed in sub-wait to await transport home with oxygen applied BNC. Call light tied to chair. No further needs expressed. Will continue to monitor.

## 2019-09-20 NOTE — ED Notes (Signed)
Pt wears 3L of oxygen at home. [place] placed on pt. No further needs addressed. Will continue to monitor.

## 2019-09-20 NOTE — ED Notes (Addendum)
Pt awaiting transport home, states ride cannot come until 0500. EDP ok with pt staying.

## 2019-09-25 ENCOUNTER — Encounter
Admission: RE | Admit: 2019-09-25 | Discharge: 2019-09-25 | Disposition: A | Discharge status: Home / Self Care | Payer: Medicaid Other | Source: Ambulatory Visit | Attending: Surgery | Admitting: Surgery

## 2019-09-25 ENCOUNTER — Other Ambulatory Visit: Payer: Self-pay

## 2019-09-25 DIAGNOSIS — Z20828 Contact with and (suspected) exposure to other viral communicable diseases: Secondary | ICD-10-CM | POA: Insufficient documentation

## 2019-09-25 DIAGNOSIS — Z01812 Encounter for preprocedural laboratory examination: Secondary | ICD-10-CM | POA: Insufficient documentation

## 2019-09-25 HISTORY — DX: Cardiac arrhythmia, unspecified: I49.9

## 2019-09-25 HISTORY — DX: Gastro-esophageal reflux disease without esophagitis: K21.9

## 2019-09-25 HISTORY — DX: Fibromyalgia: M79.7

## 2019-09-25 HISTORY — DX: Depression, unspecified: F32.A

## 2019-09-25 HISTORY — DX: Personal history of Methicillin resistant Staphylococcus aureus infection: Z86.14

## 2019-09-25 NOTE — Patient Instructions (Signed)
Your procedure is scheduled on: Friday 11/27 Report to Day Surgery. To find out your arrival time please call [phone] between 1PM - 3PM on Tomorrow.  Remember: Instructions that are not followed completely may result in serious medical risk,  up to and including death, or upon the discretion of your surgeon and anesthesiologist your  surgery may need to be rescheduled.     _X__ 1. Do not eat food after midnight the night before your procedure.                 No gum chewing or hard candies. You may drink clear liquids up to 2 hours                 before you are scheduled to arrive for your surgery- DO not drink clear                 liquids within 2 hours of the start of your surgery.                 Clear Liquids include:  water, apple juice without pulp, clear carbohydrate                 drink such as Clearfast of Gatorade, Black Coffee or Tea (Do not add                 anything to coffee or tea).  __X__2.  On the morning of surgery brush your teeth with toothpaste and water, you                may rinse your mouth with mouthwash if you wish.  Do not swallow any toothpaste of mouthwash.     ___ 3.  No Alcohol for 24 hours before or after surgery.   ___ 4.  Do Not Smoke or use e-cigarettes For 24 Hours Prior to Your Surgery.                 Do not use any chewable tobacco products for at least 6 hours prior to                 surgery.  ____  5.  Bring all medications with you on the day of surgery if instructed.   _x___  6.  Notify your doctor if there is any change in your medical condition      (cold, fever, infections).     Do not wear jewelry, make-up, hairpins, clips or nail polish. Do not wear lotions, powders, or perfumes. You may wear deodorant. Do not shave 48 hours prior to surgery. Men may shave face and neck. Do not bring valuables to the hospital.    [facility] is not responsible for any belongings or valuables.  Contacts, dentures  or bridgework may not be worn into surgery. Leave your suitcase in the car. After surgery it may be brought to your room. For patients admitted to the hospital, discharge time is determined by your treatment team.   Patients discharged the day of surgery will not be allowed to drive home.   Please read over the following fact sheets that you were given:    _x___ Take these medicines the morning of surgery with A SIP OF WATER:    1. oxyCODONE (ROXICODONE) 5 MG immediate release tablet if needed  2. clonazePAM (KLONOPIN) 1 MG tablet  3. esomeprazole (NEXIUM) 20 MG capsule  4.pregabalin (LYRICA  5.  6.  ____ Fleet Enema (as directed)  _x___ Use Sage Wipes as directed  _x___ Use inhalers on the day of surgery albuterol (VENTOLIN HFA) 108 (90 Base) MCG/ACT inhaler and bring with you, ipratropium (ATROVENT HFA) 17 MCG/ACT inhaler   ____ Stop metformin 2 days prior to surgery    ____ Take 1/2 of usual insulin dose the night before surgery. No insulin the morning          of surgery.   ____ Stop Coumadin/Plavix/aspirin on  __x__ Stop Anti-inflammatories  Ibuprofen aleve or aspirin   ____ Stop supplements until after surgery.    ____ Bring C-Pap to the hospital.

## 2019-09-26 LAB — SARS CORONAVIRUS 2 (TAT 6-24 HRS): SARS Coronavirus 2: NEGATIVE

## 2019-09-27 MED ORDER — INDOCYANINE GREEN 25 MG IV SOLR
1.2500 mg | Freq: Once | INTRAVENOUS | Status: AC
  Administered 2019-09-28: 1.25 mg via INTRAVENOUS
  Filled 2019-09-27: qty 25

## 2019-09-28 ENCOUNTER — Ambulatory Visit: Payer: Self-pay | Admitting: Anesthesiology

## 2019-09-28 ENCOUNTER — Other Ambulatory Visit: Payer: Self-pay

## 2019-09-28 ENCOUNTER — Encounter
Admission: RE | Disposition: A | Discharge status: Home / Self Care | Payer: Self-pay | Source: Home / Self Care | Attending: Surgery

## 2019-09-28 ENCOUNTER — Ambulatory Visit
Admission: RE | Admit: 2019-09-28 | Discharge: 2019-09-28 | Disposition: A | Discharge status: Home / Self Care | Payer: Self-pay | Attending: Surgery | Admitting: Surgery

## 2019-09-28 ENCOUNTER — Encounter: Payer: Self-pay | Admitting: *Deleted

## 2019-09-28 DIAGNOSIS — I252 Old myocardial infarction: Secondary | ICD-10-CM | POA: Insufficient documentation

## 2019-09-28 DIAGNOSIS — G4730 Sleep apnea, unspecified: Secondary | ICD-10-CM | POA: Insufficient documentation

## 2019-09-28 DIAGNOSIS — K219 Gastro-esophageal reflux disease without esophagitis: Secondary | ICD-10-CM | POA: Insufficient documentation

## 2019-09-28 DIAGNOSIS — Z8614 Personal history of Methicillin resistant Staphylococcus aureus infection: Secondary | ICD-10-CM | POA: Insufficient documentation

## 2019-09-28 DIAGNOSIS — I110 Hypertensive heart disease with heart failure: Secondary | ICD-10-CM | POA: Insufficient documentation

## 2019-09-28 DIAGNOSIS — Z87891 Personal history of nicotine dependence: Secondary | ICD-10-CM | POA: Insufficient documentation

## 2019-09-28 DIAGNOSIS — G709 Myoneural disorder, unspecified: Secondary | ICD-10-CM | POA: Insufficient documentation

## 2019-09-28 DIAGNOSIS — Z9981 Dependence on supplemental oxygen: Secondary | ICD-10-CM | POA: Insufficient documentation

## 2019-09-28 DIAGNOSIS — F329 Major depressive disorder, single episode, unspecified: Secondary | ICD-10-CM | POA: Insufficient documentation

## 2019-09-28 DIAGNOSIS — I69351 Hemiplegia and hemiparesis following cerebral infarction affecting right dominant side: Secondary | ICD-10-CM | POA: Insufficient documentation

## 2019-09-28 DIAGNOSIS — M1990 Unspecified osteoarthritis, unspecified site: Secondary | ICD-10-CM | POA: Insufficient documentation

## 2019-09-28 DIAGNOSIS — K8010 Calculus of gallbladder with chronic cholecystitis without obstruction: Secondary | ICD-10-CM | POA: Insufficient documentation

## 2019-09-28 DIAGNOSIS — K5900 Constipation, unspecified: Secondary | ICD-10-CM | POA: Insufficient documentation

## 2019-09-28 DIAGNOSIS — M797 Fibromyalgia: Secondary | ICD-10-CM | POA: Insufficient documentation

## 2019-09-28 DIAGNOSIS — Z79899 Other long term (current) drug therapy: Secondary | ICD-10-CM | POA: Insufficient documentation

## 2019-09-28 DIAGNOSIS — J449 Chronic obstructive pulmonary disease, unspecified: Secondary | ICD-10-CM | POA: Insufficient documentation

## 2019-09-28 DIAGNOSIS — F419 Anxiety disorder, unspecified: Secondary | ICD-10-CM | POA: Insufficient documentation

## 2019-09-28 DIAGNOSIS — I2510 Atherosclerotic heart disease of native coronary artery without angina pectoris: Secondary | ICD-10-CM | POA: Insufficient documentation

## 2019-09-28 HISTORY — DX: Nausea with vomiting, unspecified: R11.2

## 2019-09-28 HISTORY — DX: Unspecified asthma, uncomplicated: J45.909

## 2019-09-28 HISTORY — DX: Other specified postprocedural states: Z98.890

## 2019-09-28 HISTORY — DX: Heart failure, unspecified: I50.9

## 2019-09-28 HISTORY — DX: Other complications of anesthesia, initial encounter: T88.59XA

## 2019-09-28 SURGERY — CHOLECYSTECTOMY, ROBOT-ASSISTED, LAPAROSCOPIC
Anesthesia: General | Site: Abdomen

## 2019-09-28 MED ORDER — PROPOFOL 10 MG/ML IV BOLUS
INTRAVENOUS | Status: DC | PRN
  Administered 2019-09-28: 120 mg via INTRAVENOUS
  Administered 2019-09-28: 40 mg via INTRAVENOUS

## 2019-09-28 MED ORDER — DEXAMETHASONE SODIUM PHOSPHATE 10 MG/ML IJ SOLN
INTRAMUSCULAR | Status: DC | PRN
  Administered 2019-09-28: 10 mg via INTRAVENOUS

## 2019-09-28 MED ORDER — SUCCINYLCHOLINE CHLORIDE 20 MG/ML IJ SOLN
INTRAMUSCULAR | Status: DC | PRN
  Administered 2019-09-28: 100 mg via INTRAVENOUS

## 2019-09-28 MED ORDER — CEFAZOLIN SODIUM-DEXTROSE 2-4 GM/100ML-% IV SOLN
INTRAVENOUS | Status: AC
  Filled 2019-09-28: qty 100

## 2019-09-28 MED ORDER — BUPIVACAINE HCL (PF) 0.5 % IJ SOLN
INTRAMUSCULAR | Status: DC | PRN
  Administered 2019-09-28: 5 mL

## 2019-09-28 MED ORDER — FENTANYL CITRATE (PF) 100 MCG/2ML IJ SOLN
25.0000 ug | INTRAMUSCULAR | Status: DC | PRN
  Administered 2019-09-28 (×3): 25 ug via INTRAVENOUS

## 2019-09-28 MED ORDER — LIDOCAINE HCL (CARDIAC) PF 100 MG/5ML IV SOSY
PREFILLED_SYRINGE | INTRAVENOUS | Status: DC | PRN
  Administered 2019-09-28: 80 mg via INTRAVENOUS

## 2019-09-28 MED ORDER — ACETAMINOPHEN 500 MG PO TABS
ORAL_TABLET | ORAL | Status: AC
  Administered 2019-09-28: 1000 mg via ORAL
  Filled 2019-09-28: qty 2

## 2019-09-28 MED ORDER — ONDANSETRON HCL 4 MG/2ML IJ SOLN
4.0000 mg | Freq: Once | INTRAMUSCULAR | Status: AC
  Administered 2019-09-28: 12:00:00 4 mg via INTRAVENOUS

## 2019-09-28 MED ORDER — ROCURONIUM BROMIDE 50 MG/5ML IV SOLN
INTRAVENOUS | Status: AC
  Filled 2019-09-28: qty 1

## 2019-09-28 MED ORDER — ROCURONIUM BROMIDE 100 MG/10ML IV SOLN
INTRAVENOUS | Status: DC | PRN
  Administered 2019-09-28: 10 mg via INTRAVENOUS
  Administered 2019-09-28: 20 mg via INTRAVENOUS
  Administered 2019-09-28 (×3): 10 mg via INTRAVENOUS

## 2019-09-28 MED ORDER — ONDANSETRON HCL 4 MG/2ML IJ SOLN
INTRAMUSCULAR | Status: AC
  Filled 2019-09-28: qty 2

## 2019-09-28 MED ORDER — CEFAZOLIN SODIUM-DEXTROSE 2-4 GM/100ML-% IV SOLN
2.0000 g | INTRAVENOUS | Status: AC
  Administered 2019-09-28: 2 g via INTRAVENOUS

## 2019-09-28 MED ORDER — ONDANSETRON HCL 4 MG/2ML IJ SOLN
INTRAMUSCULAR | Status: DC | PRN
  Administered 2019-09-28: 4 mg via INTRAVENOUS

## 2019-09-28 MED ORDER — FENTANYL CITRATE (PF) 100 MCG/2ML IJ SOLN
INTRAMUSCULAR | Status: DC | PRN
  Administered 2019-09-28 (×2): 25 ug via INTRAVENOUS
  Administered 2019-09-28: 50 ug via INTRAVENOUS
  Administered 2019-09-28 (×4): 25 ug via INTRAVENOUS

## 2019-09-28 MED ORDER — OXYCODONE HCL 5 MG/5ML PO SOLN: 5.0000 mg | Freq: Once | ORAL | Status: AC | PRN

## 2019-09-28 MED ORDER — LIDOCAINE-EPINEPHRINE 1 %-1:100000 IJ SOLN
INTRAMUSCULAR | Status: AC
  Filled 2019-09-28: qty 1

## 2019-09-28 MED ORDER — ONDANSETRON HCL 4 MG/2ML IJ SOLN
INTRAMUSCULAR | Status: AC
  Administered 2019-09-28: 12:00:00 4 mg via INTRAVENOUS
  Filled 2019-09-28: qty 2

## 2019-09-28 MED ORDER — FENTANYL CITRATE (PF) 250 MCG/5ML IJ SOLN
INTRAMUSCULAR | Status: AC
  Filled 2019-09-28: qty 5

## 2019-09-28 MED ORDER — SUGAMMADEX SODIUM 200 MG/2ML IV SOLN
INTRAVENOUS | Status: AC
  Filled 2019-09-28: qty 2

## 2019-09-28 MED ORDER — FENTANYL CITRATE (PF) 100 MCG/2ML IJ SOLN
INTRAMUSCULAR | Status: AC
  Administered 2019-09-28: 25 ug via INTRAVENOUS
  Filled 2019-09-28: qty 2

## 2019-09-28 MED ORDER — SUCCINYLCHOLINE CHLORIDE 20 MG/ML IJ SOLN
INTRAMUSCULAR | Status: AC
  Filled 2019-09-28: qty 1

## 2019-09-28 MED ORDER — LIDOCAINE-EPINEPHRINE 1 %-1:100000 IJ SOLN
INTRAMUSCULAR | Status: DC | PRN
  Administered 2019-09-28: 5 mL

## 2019-09-28 MED ORDER — OXYCODONE HCL 5 MG PO TABS
ORAL_TABLET | ORAL | Status: AC
  Filled 2019-09-28: qty 1

## 2019-09-28 MED ORDER — ONDANSETRON 4 MG PO TBDP: 4.0000 mg | ORAL_TABLET | Freq: Three times a day (TID) | ORAL | 0 refills | Status: AC | PRN

## 2019-09-28 MED ORDER — DEXAMETHASONE SODIUM PHOSPHATE 10 MG/ML IJ SOLN
INTRAMUSCULAR | Status: AC
  Filled 2019-09-28: qty 1

## 2019-09-28 MED ORDER — LACTATED RINGERS IV SOLN
INTRAVENOUS | Status: DC
  Administered 2019-09-28: 07:00:00 50 mL/h via INTRAVENOUS
  Administered 2019-09-28: 09:00:00 via INTRAVENOUS

## 2019-09-28 MED ORDER — BUPIVACAINE HCL (PF) 0.5 % IJ SOLN
INTRAMUSCULAR | Status: AC
  Filled 2019-09-28: qty 30

## 2019-09-28 MED ORDER — ACETAMINOPHEN 325 MG PO TABS: 650.0000 mg | ORAL_TABLET | Freq: Three times a day (TID) | ORAL | 0 refills | Status: AC | PRN

## 2019-09-28 MED ORDER — CHLORHEXIDINE GLUCONATE CLOTH 2 % EX PADS: 6.0000 | MEDICATED_PAD | Freq: Once | CUTANEOUS | Status: DC

## 2019-09-28 MED ORDER — PHENYLEPHRINE HCL (PRESSORS) 10 MG/ML IV SOLN
INTRAVENOUS | Status: DC | PRN
  Administered 2019-09-28: 100 ug via INTRAVENOUS

## 2019-09-28 MED ORDER — SODIUM CHLORIDE FLUSH 0.9 % IV SOLN
INTRAVENOUS | Status: AC
  Filled 2019-09-28: qty 10

## 2019-09-28 MED ORDER — LIDOCAINE HCL (PF) 2 % IJ SOLN
INTRAMUSCULAR | Status: AC
  Filled 2019-09-28: qty 20

## 2019-09-28 MED ORDER — MIDAZOLAM HCL 2 MG/2ML IJ SOLN
INTRAMUSCULAR | Status: DC | PRN
  Administered 2019-09-28: 2 mg via INTRAVENOUS

## 2019-09-28 MED ORDER — OXYCODONE HCL 5 MG PO TABS
5.0000 mg | ORAL_TABLET | Freq: Once | ORAL | Status: AC | PRN
  Administered 2019-09-28: 10:00:00 5 mg via ORAL

## 2019-09-28 MED ORDER — PROPOFOL 500 MG/50ML IV EMUL
INTRAVENOUS | Status: AC
  Filled 2019-09-28: qty 50

## 2019-09-28 MED ORDER — MIDAZOLAM HCL 2 MG/2ML IJ SOLN
INTRAMUSCULAR | Status: AC
  Filled 2019-09-28: qty 2

## 2019-09-28 MED ORDER — PHENYLEPHRINE HCL (PRESSORS) 10 MG/ML IV SOLN
INTRAVENOUS | Status: AC
  Filled 2019-09-28: qty 1

## 2019-09-28 MED ORDER — DEXMEDETOMIDINE HCL IN NACL 80 MCG/20ML IV SOLN
INTRAVENOUS | Status: AC
  Filled 2019-09-28: qty 20

## 2019-09-28 MED ORDER — SUGAMMADEX SODIUM 200 MG/2ML IV SOLN
INTRAVENOUS | Status: DC | PRN
  Administered 2019-09-28: 150 mg via INTRAVENOUS

## 2019-09-28 MED ORDER — HYDROCODONE-ACETAMINOPHEN 5-325 MG PO TABS: 1.0000 | ORAL_TABLET | Freq: Four times a day (QID) | ORAL | 0 refills | Status: AC | PRN

## 2019-09-28 MED ORDER — ACETAMINOPHEN 500 MG PO TABS
1000.0000 mg | ORAL_TABLET | ORAL | Status: AC
  Administered 2019-09-28: 07:00:00 1000 mg via ORAL

## 2019-09-28 SURGICAL SUPPLY — 65 items
"PENCIL ELECTRO HAND CTR " (MISCELLANEOUS) ×2 IMPLANT
ANCHOR TIS RET SYS 235ML (MISCELLANEOUS) ×3 IMPLANT
BAG INFUSER PRESSURE 100CC (MISCELLANEOUS) ×2 IMPLANT
BLADE SURG SZ11 CARB STEEL (BLADE) ×3 IMPLANT
CANISTER SUCT 1200ML W/VALVE (MISCELLANEOUS) ×3 IMPLANT
CANNULA REDUC XI 12-8 STAPL (CANNULA) ×1
CANNULA REDUCER 12-8 DVNC XI (CANNULA) ×2 IMPLANT
CHLORAPREP W/TINT 26 (MISCELLANEOUS) ×3 IMPLANT
CLIP VESOLOCK MED LG 6/CT (CLIP) ×3 IMPLANT
COVER LIGHT HANDLE STERIS (MISCELLANEOUS) ×1 IMPLANT
COVER TIP SHEARS 8 DVNC (MISCELLANEOUS) ×2 IMPLANT
COVER TIP SHEARS 8MM DA VINCI (MISCELLANEOUS) ×1
COVER WAND RF STERILE (DRAPES) ×3 IMPLANT
DECANTER SPIKE VIAL GLASS SM (MISCELLANEOUS) ×4 IMPLANT
DEFOGGER SCOPE WARMER CLEARIFY (MISCELLANEOUS) ×3 IMPLANT
DERMABOND ADVANCED (GAUZE/BANDAGES/DRESSINGS) ×1
DERMABOND ADVANCED .7 DNX12 (GAUZE/BANDAGES/DRESSINGS) ×2 IMPLANT
DRAPE 3/4 80X56 (DRAPES) ×3 IMPLANT
DRAPE ARM DVNC X/XI (DISPOSABLE) ×8 IMPLANT
DRAPE COLUMN DVNC XI (DISPOSABLE) ×2 IMPLANT
DRAPE DA VINCI XI ARM (DISPOSABLE) ×4
DRAPE DA VINCI XI COLUMN (DISPOSABLE) ×1
ELECT CAUTERY BLADE 6.4 (BLADE) ×3 IMPLANT
ELECT REM PT RETURN 9FT ADLT (ELECTROSURGICAL) ×3
ELECTRODE REM PT RTRN 9FT ADLT (ELECTROSURGICAL) ×2 IMPLANT
GLOVE BIOGEL PI IND STRL 7.0 (GLOVE) ×4 IMPLANT
GLOVE BIOGEL PI INDICATOR 7.0 (GLOVE) ×2
GLOVE SURG SYN 6.5 ES PF (GLOVE) ×6 IMPLANT
GLOVE SURG SYN 6.5 PF PI (GLOVE) ×4 IMPLANT
GOWN STRL REUS W/ TWL LRG LVL3 (GOWN DISPOSABLE) ×6 IMPLANT
GOWN STRL REUS W/TWL LRG LVL3 (GOWN DISPOSABLE) ×3
GRASPER SUT TROCAR 14GX15 (MISCELLANEOUS) ×3 IMPLANT
HEMOSTAT SURGICEL 2X3 (HEMOSTASIS) ×1 IMPLANT
IRRIGATOR SUCT 8 DISP DVNC XI (IRRIGATION / IRRIGATOR) ×2 IMPLANT
IRRIGATOR SUCTION 8MM XI DISP (IRRIGATION / IRRIGATOR)
IV NS 1000ML (IV SOLUTION)
IV NS 1000ML BAXH (IV SOLUTION) IMPLANT
KIT PINK PAD W/HEAD ARE REST (MISCELLANEOUS) ×3
KIT PINK PAD W/HEAD ARM REST (MISCELLANEOUS) ×2 IMPLANT
LABEL OR SOLS (LABEL) ×3 IMPLANT
NDL INSUFFLATION 14GA 120MM (NEEDLE) IMPLANT
NEEDLE HYPO 22GX1.5 SAFETY (NEEDLE) ×3 IMPLANT
NEEDLE INSUFFLATION 14GA 120MM (NEEDLE) ×3 IMPLANT
NEEDLE VERESS 14GA 120MM (NEEDLE) ×2 IMPLANT
NS IRRIG 500ML POUR BTL (IV SOLUTION) ×3 IMPLANT
OBTURATOR OPTICAL STANDARD 8MM (TROCAR) ×1
OBTURATOR OPTICAL STND 8 DVNC (TROCAR) ×2
OBTURATOR OPTICALSTD 8 DVNC (TROCAR) ×2 IMPLANT
PACK LAP CHOLECYSTECTOMY (MISCELLANEOUS) ×3 IMPLANT
PENCIL ELECTRO HAND CTR (MISCELLANEOUS) ×3 IMPLANT
SEAL CANN UNIV 5-8 DVNC XI (MISCELLANEOUS) ×6 IMPLANT
SEAL XI 5MM-8MM UNIVERSAL (MISCELLANEOUS) ×3
SOLUTION ELECTROLUBE (MISCELLANEOUS) ×3 IMPLANT
SPONGE GAUZE 2X2 8PLY STRL LF (GAUZE/BANDAGES/DRESSINGS) ×1 IMPLANT
STAPLER CANNULA SEAL DVNC XI (STAPLE) ×2 IMPLANT
STAPLER CANNULA SEAL XI (STAPLE) ×1
SUT MNCRL 4-0 (SUTURE) ×1
SUT MNCRL 4-0 27XMFL (SUTURE) ×2
SUT VIC AB 3-0 SH 27 (SUTURE) ×1
SUT VIC AB 3-0 SH 27X BRD (SUTURE) ×2 IMPLANT
SUT VICRYL 0 AB UR-6 (SUTURE) ×3 IMPLANT
SUTURE MNCRL 4-0 27XMF (SUTURE) ×2 IMPLANT
SYR 20ML LL LF (SYRINGE) ×3 IMPLANT
TROCAR XCEL NON-BLD 5MMX100MML (ENDOMECHANICALS) ×3 IMPLANT
TUBING EVAC SMOKE HEATED PNEUM (TUBING) ×3 IMPLANT

## 2019-09-28 NOTE — Anesthesia Procedure Notes (Signed)
Procedure Name: Intubation Date/Time: 09/28/2019 7:39 AM Performed by: Leeroy Cha, CRNA Pre-anesthesia Checklist: Patient identified, Emergency Drugs available, Suction available and Patient being monitored Patient Re-evaluated:Patient Re-evaluated prior to induction Oxygen Delivery Method: Circle system utilized Preoxygenation: Pre-oxygenation with 100% oxygen Induction Type: IV induction Ventilation: Mask ventilation without difficulty Laryngoscope Size: McGraph and 3 Grade View: Grade I Tube type: Oral Tube size: 7.0 mm Number of attempts: 1 Airway Equipment and Method: Stylet and Oral airway Placement Confirmation: ETT inserted through vocal cords under direct vision,  positive ETCO2 and breath sounds checked- equal and bilateral Secured at: 21 cm Tube secured with: Tape Dental Injury: Teeth and Oropharynx as per pre-operative assessment

## 2019-09-28 NOTE — Transfer of Care (Signed)
Immediate Anesthesia Transfer of Care Note  Patient: Jessica Moran  Procedure(s) Performed: XI ROBOTIC ASSISTED LAPAROSCOPIC CHOLECYSTECTOMY (N/A Abdomen) INDOCYANINE GREEN FLUORESCENCE IMAGING (ICG) (N/A )  Patient Location: PACU  Anesthesia Type:General  Level of Consciousness: awake, drowsy and patient cooperative  Airway & Oxygen Therapy: Patient Spontanous Breathing and Patient connected to face mask oxygen  Post-op Assessment: Report given to RN and Post -op Vital signs reviewed and stable  Post vital signs: Reviewed and stable  Last Vitals:  Vitals Value Taken Time  BP 154/88 09/28/19 0939  Temp    Pulse 95 09/28/19 0942  Resp 15 09/28/19 0942  SpO2 100 % 09/28/19 0942  Vitals shown include unvalidated device data.  Last Pain:  Vitals:   09/28/19 0630  TempSrc: Oral  PainSc: 6       Patients Stated Pain Goal: 1 (67/01/41 0301)  Complications: No apparent anesthesia complications

## 2019-09-28 NOTE — Anesthesia Post-op Follow-up Note (Signed)
Anesthesia QCDR form completed.        

## 2019-09-28 NOTE — Anesthesia Postprocedure Evaluation (Signed)
Anesthesia Post Note  Patient: Jessica Moran  Procedure(s) Performed: XI ROBOTIC ASSISTED LAPAROSCOPIC CHOLECYSTECTOMY (N/A Abdomen) INDOCYANINE GREEN FLUORESCENCE IMAGING (ICG) (N/A )  Patient location during evaluation: PACU Anesthesia Type: General Level of consciousness: awake and alert Pain management: pain level controlled Vital Signs Assessment: post-procedure vital signs reviewed and stable Respiratory status: spontaneous breathing, nonlabored ventilation, respiratory function stable and patient connected to nasal cannula oxygen Cardiovascular status: blood pressure returned to baseline and stable Postop Assessment: no apparent nausea or vomiting Anesthetic complications: no     Last Vitals:  Vitals:   09/28/19 1027 09/28/19 1038  BP:  127/77  Pulse: 86 86  Resp: 14 16  Temp:  36.7 C  SpO2: 92% 95%    Last Pain:  Vitals:   09/28/19 1038  TempSrc: Temporal  PainSc: 6                  Precious Haws Piscitello

## 2019-09-28 NOTE — Interval H&P Note (Signed)
History and Physical Interval Note:  09/28/2019 7:13 AM  Jessica Moran  has presented today for surgery, with the diagnosis of K81.1 Chronic Cholecystitis.  The various methods of treatment have been discussed with the patient and family. After consideration of risks, benefits and other options for treatment, the patient has consented to  Procedure(s): XI ROBOTIC [place] (N/A) as a surgical intervention.  The patient's history has been reviewed, patient examined, no change in status, stable for surgery.  I have reviewed the patient's chart and labs.  Questions were answered to the patient's satisfaction.     Tayley Mudrick Lysle Pearl

## 2019-09-28 NOTE — Discharge Instructions (Signed)
Oxycodone 5mg  taken 09/28/2019 at 10:18am    Laparoscopic Cholecystectomy, Care After This sheet gives you information about how to care for yourself after your procedure. Your doctor may also give you more specific instructions. If you have problems or questions, contact your doctor. Follow these instructions at home: Care for cuts from surgery (incisions)   Follow instructions from your doctor about how to take care of your cuts from surgery. Make sure you: ? Wash your hands with soap and water before you change your bandage (dressing). If you cannot use soap and water, use hand sanitizer. ? Change your bandage as told by your doctor. ? Leave stitches (sutures), skin glue, or skin tape (adhesive) strips in place. They may need to stay in place for 2 weeks or longer. If tape strips get loose and curl up, you may trim the loose edges. Do not remove tape strips completely unless your doctor says it is okay.  Do not take baths, swim, or use a hot tub until your doctor says it is okay. OK TO SHOWER 24HRS AFTER YOUR SURGERY. KEEP FORMER DRAIN SITE AREA COVERED WITH BAND-AID UNTIL HEALED  Check your surgical cut area every day for signs of infection. Check for: ? More redness, swelling, or pain. ? More fluid or blood. ? Warmth. ? Pus or a bad smell. Activity  Do not drive or use heavy machinery while taking prescription pain medicine.  Do not play contact sports until your doctor says it is okay.  Do not drive for 24 hours if you were given a medicine to help you relax (sedative).  Rest as needed. Do not return to work or school until your doctor says it is okay. General instructions   tylenol and advil as needed for discomfort.  Please alternate between the two every four hours as needed for pain.     Use narcotics, if prescribed, only when tylenol and motrin is not enough to control pain.   325-650mg  every 8hrs to max of 3000mg /24hrs (including the 325mg  in every norco dose) for  the tylenol.     Advil up to 800mg  per dose every 8hrs as needed for pain.    To prevent or treat constipation while you are taking prescription pain medicine, your doctor may recommend that you: ? Drink enough fluid to keep your pee (urine) clear or pale yellow. ? Take over-the-counter or prescription medicines. ? Eat foods that are high in fiber, such as fresh fruits and vegetables, whole grains, and beans. ? Limit foods that are high in fat and processed sugars, such as fried and sweet foods. Contact a doctor if:  You develop a rash.  You have more redness, swelling, or pain around your surgical cuts.  You have more fluid or blood coming from your surgical cuts.  Your surgical cuts feel warm to the touch.  You have pus or a bad smell coming from your surgical cuts.  You have a fever.  One or more of your surgical cuts breaks open. Get help right away if:  You have trouble breathing.  You have chest pain.  You have pain that is getting worse in your shoulders.  You faint or feel dizzy when you stand.  You have very bad pain in your belly (abdomen).  You are sick to your stomach (nauseous) for more than one day.  You have throwing up (vomiting) that lasts for more than one day.  You have leg pain. This information is not intended to replace  advice given to you by your health care provider. Make sure you discuss any questions you have with your health care provider. Document Released: 07/27/2008 Document Revised: 05/08/2016 Document Reviewed: 04/05/2016 Elsevier Interactive Patient Education  2019 [employer]  AMBULATORY SURGERY  DISCHARGE INSTRUCTIONS   1) The drugs that you were given will stay in your system until tomorrow so for the next 24 hours you should not:  A) Drive an automobile B) Make any legal decisions C) Drink any alcoholic beverage   2) You may resume regular meals tomorrow.  Today it is better to start with liquids and gradually work up to  solid foods.  You may eat anything you prefer, but it is better to start with liquids, then soup and crackers, and gradually work up to solid foods.   3) Please notify your doctor immediately if you have any unusual bleeding, trouble breathing, redness and pain at the surgery site, drainage, fever, or pain not relieved by medication.    4) Additional Instructions:        Please contact your physician with any problems or Same Day Surgery at [phone], Monday through Friday 6 am to 4 pm, or [place] at [facility] number at [phone].

## 2019-09-28 NOTE — Anesthesia Preprocedure Evaluation (Addendum)
Anesthesia Evaluation  Patient identified by MRN, date of birth, ID band Patient awake    Reviewed: Allergy & Precautions, H&P , NPO status , Patient's Chart, lab work & pertinent test results  History of Anesthesia Complications (+) PONV, Family history of anesthesia reaction and history of anesthetic complications  Airway Mallampati: III  TM Distance: <3 FB Neck ROM: limited    Dental  (+) Chipped, Poor Dentition, Missing   Pulmonary asthma , sleep apnea and Oxygen sleep apnea , pneumonia, COPD,  COPD inhaler and oxygen dependent, former smoker,           Cardiovascular hypertension, (-) angina+ CAD, + Past MI and +CHF  + dysrhythmias      Neuro/Psych Seizures -,  PSYCHIATRIC DISORDERS  Neuromuscular disease CVA (right side), Residual Symptoms    GI/Hepatic Neg liver ROS, GERD  Medicated and Controlled,  Endo/Other  negative endocrine ROS  Renal/GU      Musculoskeletal  (+) Arthritis , Fibromyalgia -  Abdominal   Peds  Hematology negative hematology ROS (+)   Anesthesia Other Findings Past Medical History: No date: Anxiety No date: Asthma No date: CHF (congestive heart failure) (HCC) No date: Complication of anesthesia No date: COPD (chronic obstructive pulmonary disease) ([place]) No date: Depression No date: Dysrhythmia     Comment:  afib 2009: Family history of adverse reaction to anesthesia     Comment:  mother never woke up from surgery No date: Fibromyalgia No date: GERD (gastroesophageal reflux disease) No date: History of methicillin resistant staphylococcus aureus (MRSA) No date: Hypertension 2020: Lung mass     Comment:  unsure if cancerous or not No date: Menopause 2015: MI (myocardial infarction) ([place]) 07/2019: Pneumonia     Comment:  admitted to [facility] No date: PONV (postoperative nausea and vomiting) No date: PTSD (post-traumatic stress disorder) No date: Seizures ([place])     Comment:   last seizure 2018 2010, 2018: Stroke [facility])     Comment:  right arm weakness residual No date: Tobacco abuse No date: Tobacco abuse counseling  Past Surgical History: No date: BREAST SURGERY     Comment:  implants 2020: cholecystostomy tube placment 1998: LAPAROSCOPIC SUPRACERVICAL HYSTERECTOMY     Comment:  [place] 1998: SPINE SURGERY  BMI    Body Mass Index: 29.88 kg/m      Reproductive/Obstetrics negative OB ROS                            Anesthesia Physical Anesthesia Plan  ASA: IV  Anesthesia Plan: General ETT   Post-op Pain Management:    Induction: Intravenous  PONV Risk Score and Plan: Ondansetron, Dexamethasone, Midazolam and Treatment may vary due to age or medical condition  Airway Management Planned: Oral ETT and Video Laryngoscope Planned  Additional Equipment:   Intra-op Plan:   Post-operative Plan: Extubation in OR  Informed Consent: I have reviewed the patients History and Physical, chart, labs and discussed the procedure including the risks, benefits and alternatives for the proposed anesthesia with the patient or authorized representative who has indicated his/her understanding and acceptance.     Dental Advisory Given  Plan Discussed with: Anesthesiologist, CRNA and Surgeon  Anesthesia Plan Comments: (Patient informed that they are higher risk for complications from anesthesia during this procedure due to their medical history.  Patient voiced understanding.  Patient consented for risks of anesthesia including but not limited to:  - adverse reactions to medications - damage to teeth,  lips or other oral mucosa - sore throat or hoarseness - Damage to heart, brain, lungs or loss of life  Patient voiced understanding.)       Anesthesia Quick Evaluation

## 2019-09-30 NOTE — Op Note (Signed)
Preoperative diagnosis:  chronic and cholecystitis  Postoperative diagnosis: same as above  Procedure: Robotic assisted Laparoscopic Cholecystectomy.   Anesthesia: GETA   Surgeon: Benjamine Sprague  Specimen: Gallbladder  Complications: None  EBL: 65mL  Wound Classification: Clean Contaminated  Indications: see HPI  Findings: Critical view of safety noted Cystic duct and artery identified, ligated and divided, clips remained intact at end of procedure Adequate hemostasis  Description of procedure:  The patient was placed on the operating table in the supine position. SCDs placed, pre-op abx administered.  General anesthesia was induced and OG tube placed by anesthesia. A time-out was completed verifying correct patient, procedure, site, positioning, and implant(s) and/or special equipment prior to beginning this procedure. The abdomen was prepped and draped in the usual sterile fashion.    Veress needle was placed at the Palmer's point and insufflation was started after confirming a positive saline drop test and no immediate increase in abdominal pressure.  After reaching 15 mm, the Veress needle was removed and a 8 mm port was placed via optiview technique under umbilicus measured 41DQ from gallbladder.  The abdomen was inspected and no abnormalities or injuries were found.  Under direct vision, ports were placed in the following locations: One 12 mm patient left of the umbilicus, 8cm from the optiviewed port, one 8 mm port placed to the patient right of the umbilical port 8 cm apart.  1 additional 8 mm port placed lateral to the 71mm port.  Once ports were placed, The table was placed in the reverse Trendelenburg position with the right side up. The Xi platform was brought into the operative field and docked to the ports successfully.  An endoscope was placed through the umbilical port, fenestrated grasper through the adjacent patient right port, prograsp to the far patient left port, and  then a hook cautery in the left port.  Gallbladder noted to be retracted upwards from cholecystostomy tube.  Adhesions between the gallbladder and omentum, duodenum and transverse colon were lysed via hook cautery. The infundibulum was grasped with the fenestrated grasper and retracted toward the right lower quadrant. This maneuver exposed Calot's triangle. The peritoneum overlying the gallbladder infundibulum was then dissected using combination of [place] dissector and electrocautery hook and the cystic duct and cystic artery identified.  Critical view of safety with the liver bed clearly visible behind the duct and artery with no additional structures noted.  The cystic duct and cystic artery clipped and divided close to the gallbladder.  2 robotic clips were placed at the base of the cystic duct, one clip for the cystic artery.   The gallbladder was then dissected from its peritoneal and liver bed attachments as well as the abdominal wal where the tube entered it.  The tube was removed shortly before transecting the attachment.  Hemostasis was checked prior to removing the pro-grasp retractor and placing the endoscope through the port it was in.  The Endo Catch bag was then placed through the 12 mm port site and the gallbladder was removed.  The gallbladder was passed off the table as a specimen. There was no evidence of bleeding from the gallbladder fossa or cystic artery or leakage of the bile from the cystic duct stump.  Bleeding was noted from the liver edge adjacent to the tube entry site at the abdominal wall, where there was some adhesions noted between liver and abominal wall.  Surgicell was placed in this are for hemostasis.  No active bleeding noted after a few minutes  and during desufflation. The 12 mm port site closed with PMI using 0 vicryl under direct vision.  Abdomen desufflated and secondary trocars were removed under direct vision. No bleeding was noted. All skin incisions then closed with  subcuticular sutures of 4-0 monocryl and dressed with topical skin adhesive.  Former drain site was dressed with 4x4 gauze.  The orogastric tube was removed and patient extubated.  The patient tolerated the procedure well and was taken to the postanesthesia care unit in stable condition.  All sponge and instrument count correct at end of procedure.

## 2019-10-01 LAB — SURGICAL PATHOLOGY

## 2019-10-08 ENCOUNTER — Other Ambulatory Visit: Payer: MEDICAID

## 2019-10-09 ENCOUNTER — Other Ambulatory Visit: Payer: MEDICAID

## 2020-06-26 IMAGING — DX DG CHEST 1V PORT
1 series · 1 of 1 positions shown · non-contrast
Comparison: None.

CLINICAL DATA: Pneumonia

EXAM:
PORTABLE CHEST 1 VIEW

[chest]
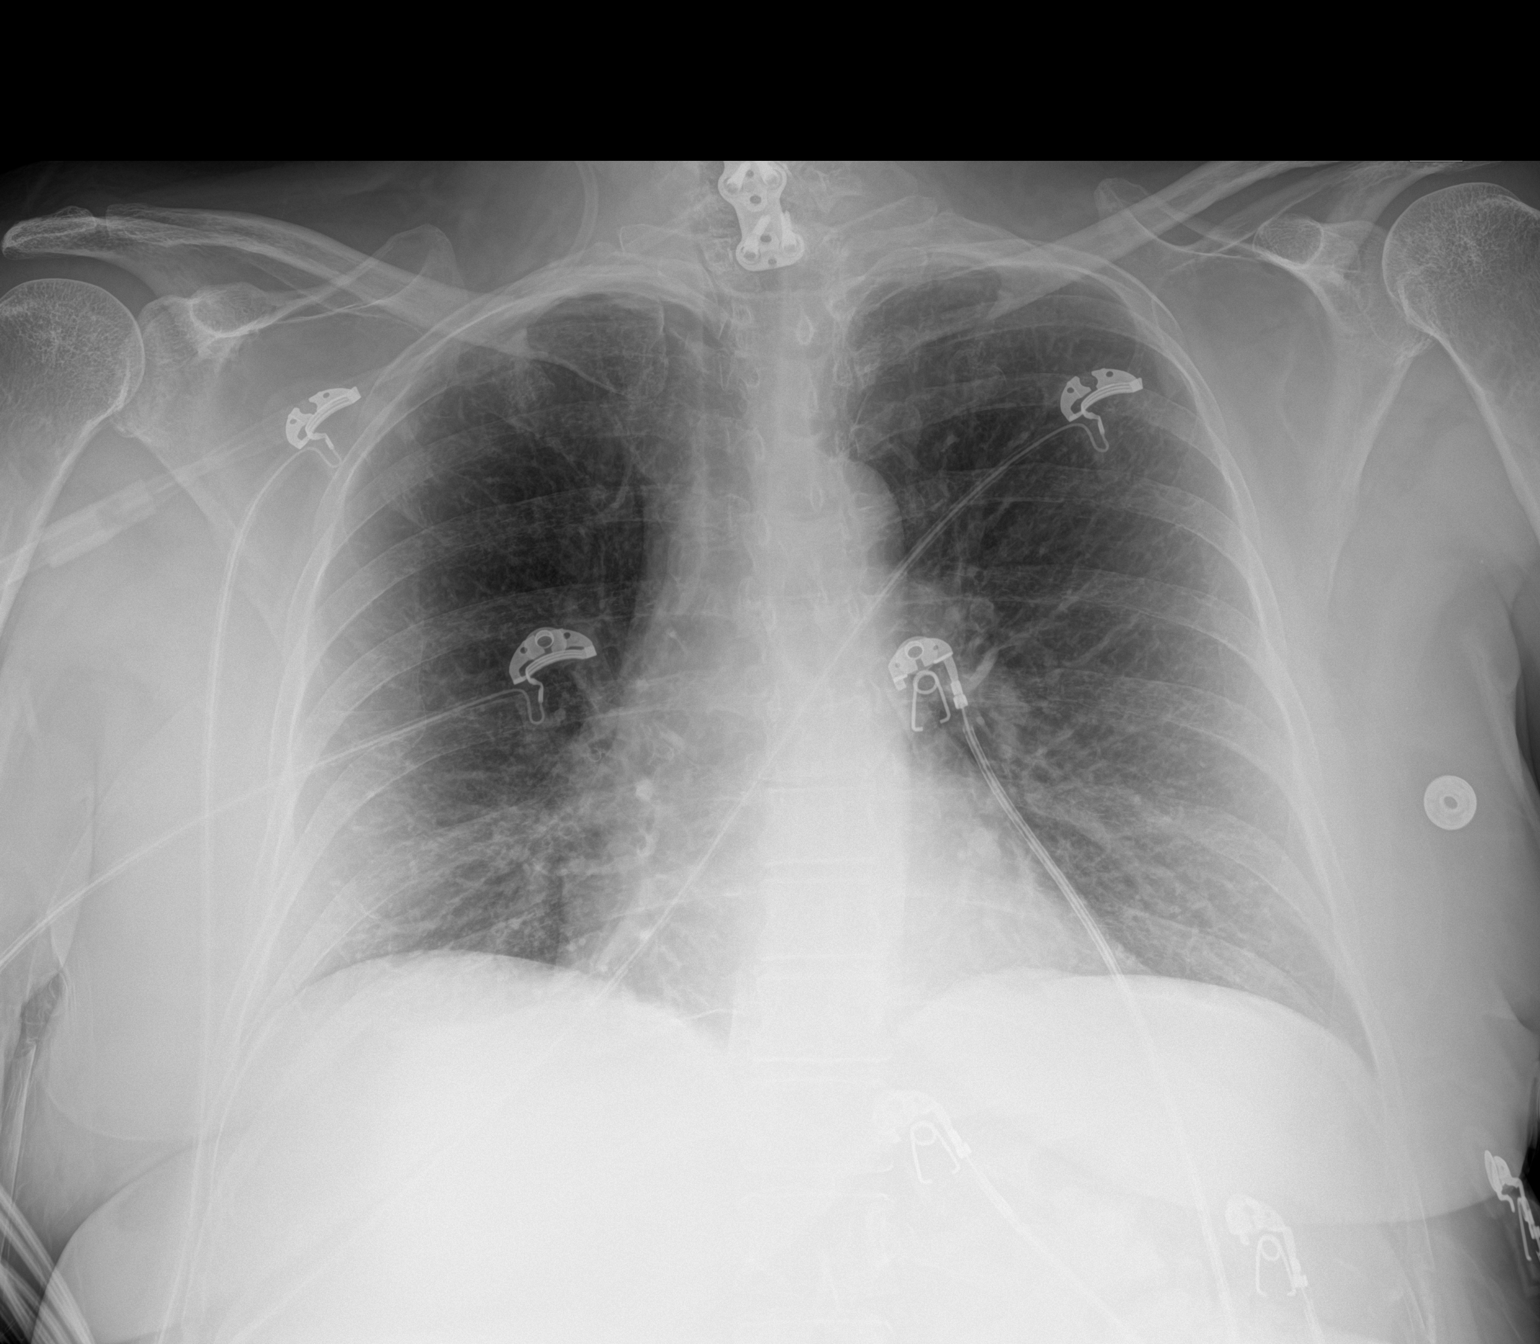

[1 of 1 positions shown; findings below may reference images not displayed]

FINDINGS: There is no large focal infiltrate. No pneumothorax. No significant
pleural effusion. The heart size is normal. There is a subtle
opacity at the right lung apex. The patient is status post ACDF of
the lower cervical spine.
IMPRESSION: 1. No definite acute cardiopulmonary process.
2. Subtle density at the right lung apex of unknown clinical
significance. A follow-up two-view chest x-ray is recommended in 4-6
weeks to confirm stability or resolution of this finding.

## 2020-07-11 IMAGING — US US ABDOMEN LIMITED
1 series · 14 of 25 positions shown · non-contrast
Comparison: 08/23/2019

CLINICAL DATA: Right upper quadrant pain

EXAM:
ULTRASOUND ABDOMEN LIMITED RIGHT UPPER QUADRANT

[Series 1: us abdomen limited · 0.22mm/px · 14 of 42 slices shown]
[im 1/42]
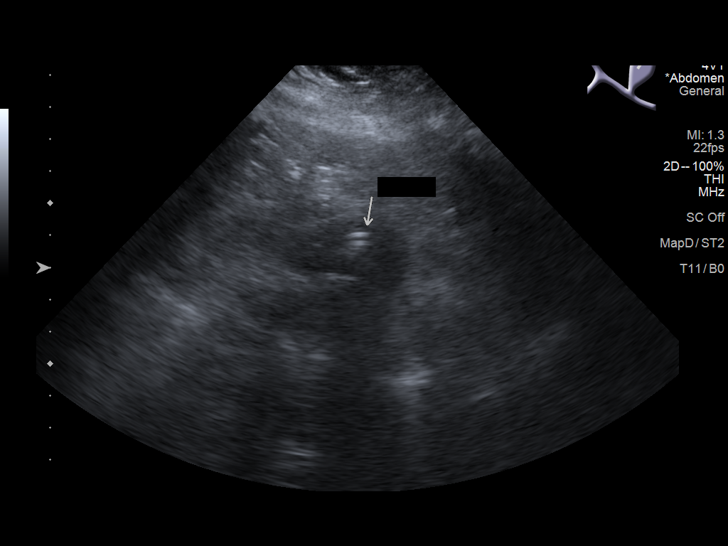
[im 4/42]
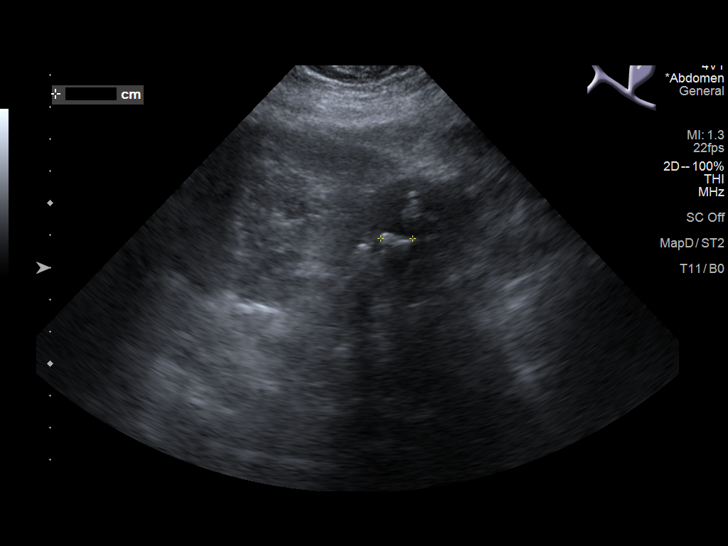
[im 7/42]
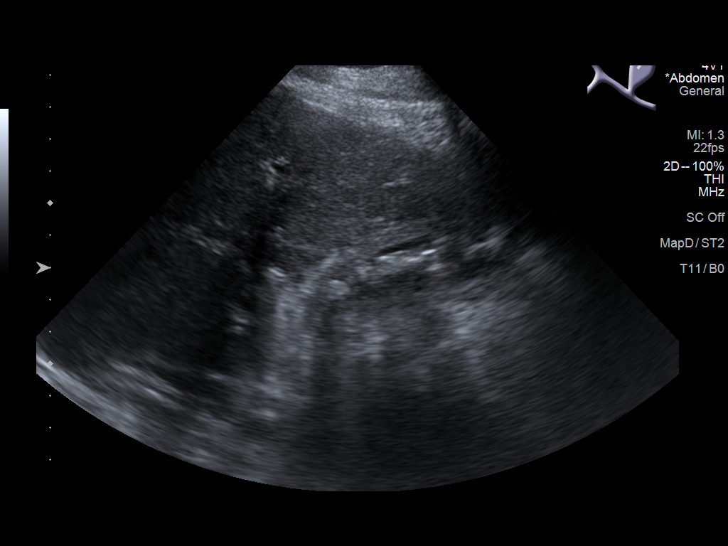
[im 11/42]
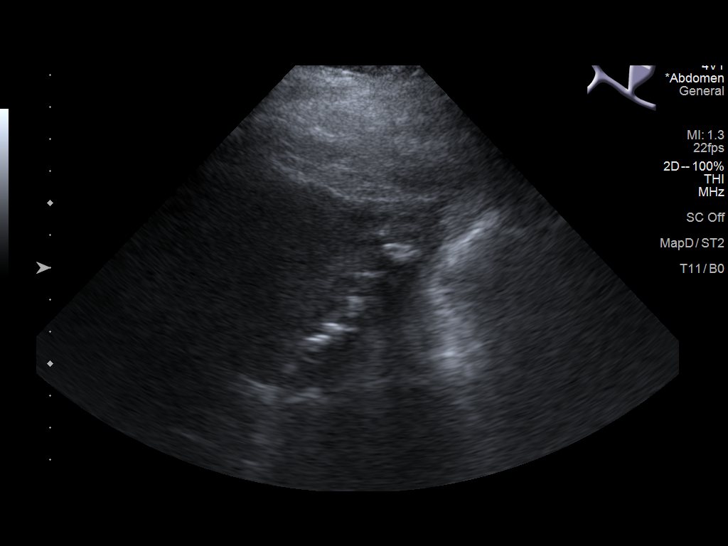
[im 14/42]
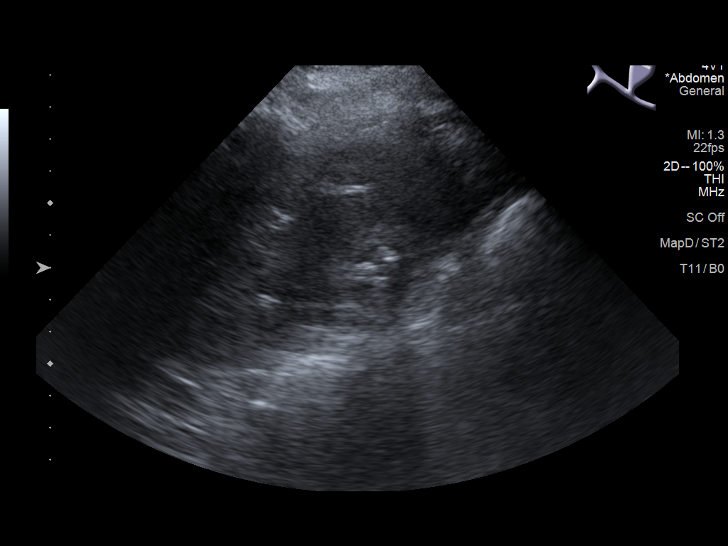
[im 16/42]
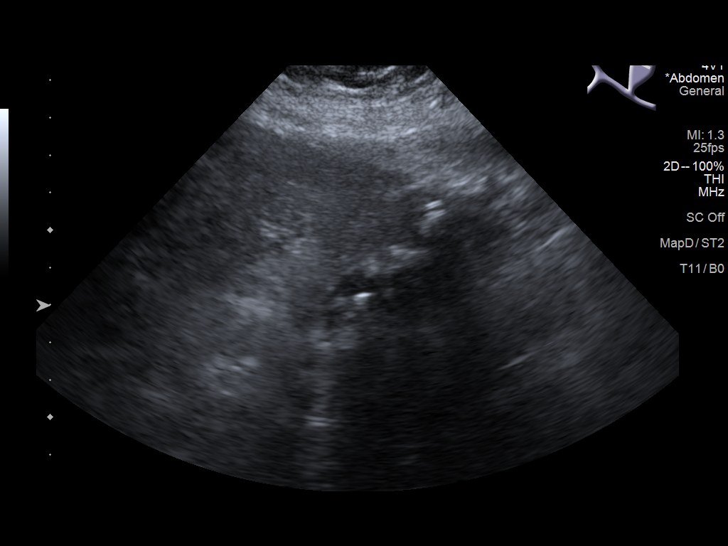
[im 19/42]
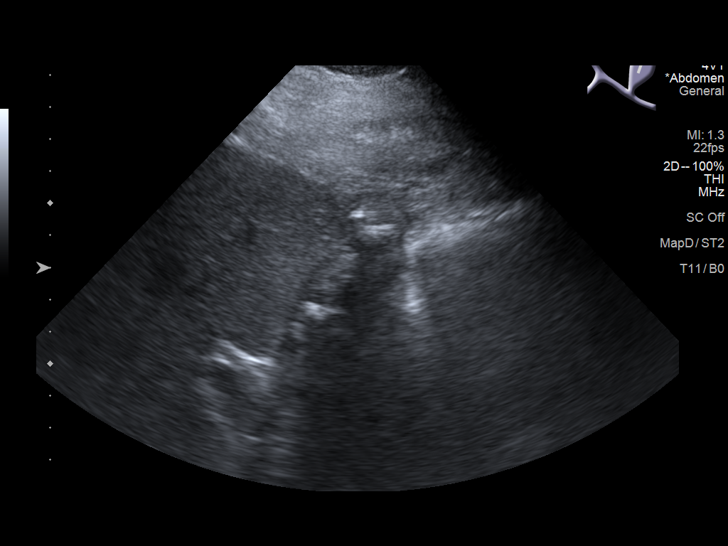
[im 23/42]
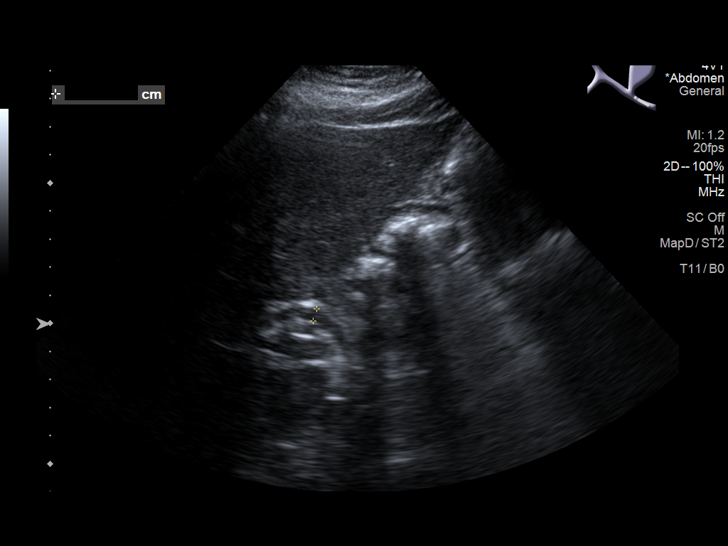
[im 26/42]
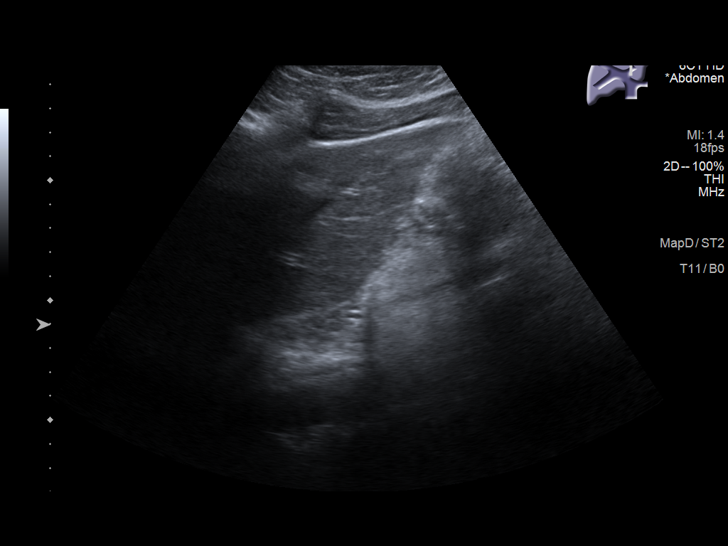
[im 28/42]
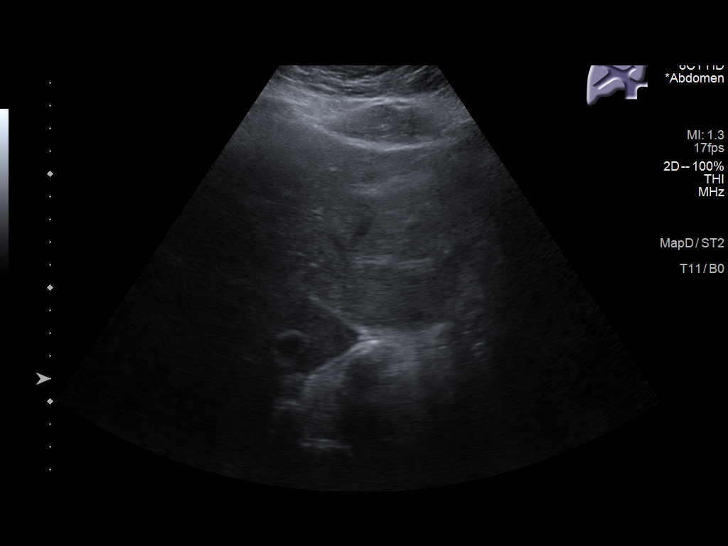
[im 31/42]
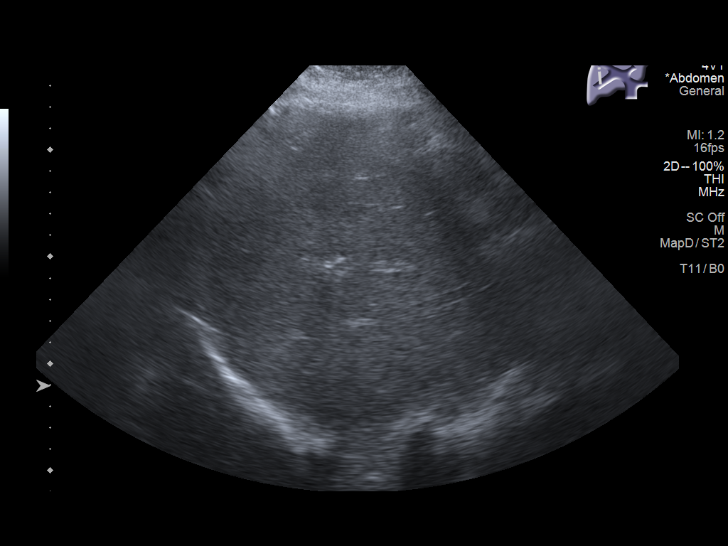
[im 35/42]
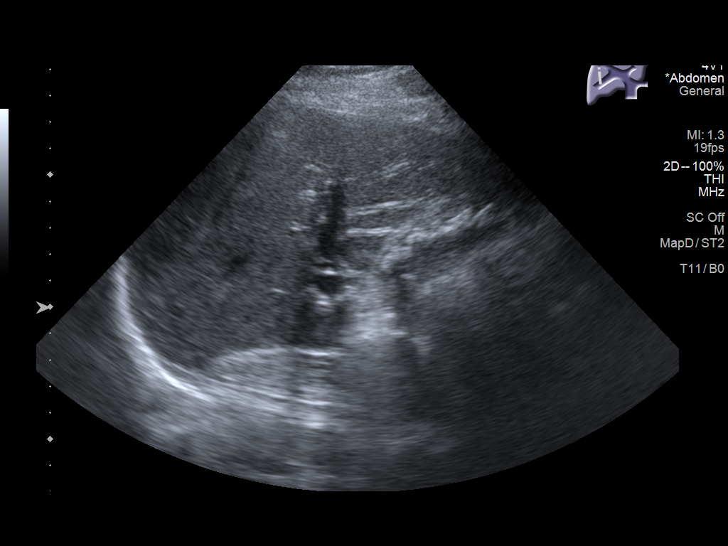
[im 38/42]
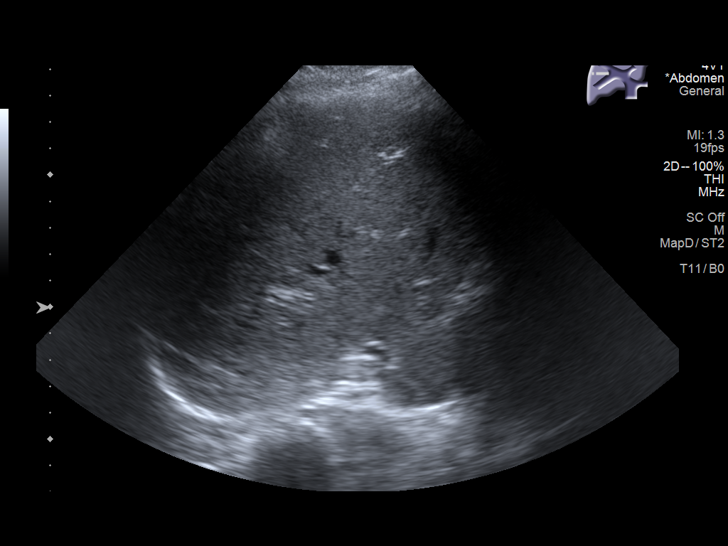
[im 42/42]
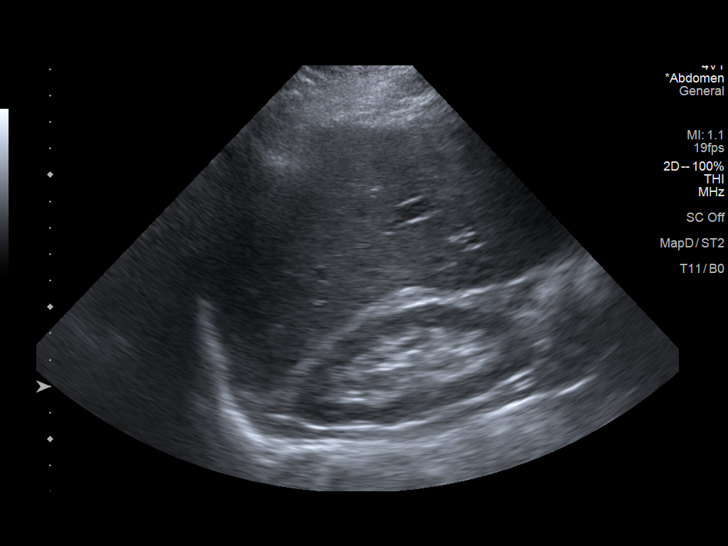

[14 of 25 positions shown; findings below may reference images not displayed]

FINDINGS: Gallbladder:

Gallbladder is decompressed when compared to prior study due to
cholecystostomy tube. Multiple gallstones throughout the
gallbladder. Gallbladder wall is thickened, measuring 6 mm. Negative
sonographic Hobbie.

Common bile duct:

Diameter: Normal caliber, 4 mm.

Liver:

No focal lesion identified. Within normal limits in parenchymal
echogenicity. Portal vein is patent on color Doppler imaging with
normal direction of blood flow towards the liver.

Other: None.
IMPRESSION: Cholecystostomy tube in place with decompression of the gallbladder.
Numerous gallstones and mild gallbladder wall thickening. Negative
sonographic Hobbie.

## 2024-07-03 ENCOUNTER — Other Ambulatory Visit: Payer: Self-pay | Admitting: Medical Genetics
